# Patient Record
Sex: Female | Born: 1935 | Race: White | Hispanic: No | Smoking: Former smoker
Health system: Southern US, Community
[De-identification: ages and names within clinical notes are randomized; demographics above are authoritative.]

## PROBLEM LIST (undated history)

## (undated) DIAGNOSIS — R011 Cardiac murmur, unspecified: Secondary | ICD-10-CM

## (undated) DIAGNOSIS — R0789 Other chest pain: Secondary | ICD-10-CM

## (undated) DIAGNOSIS — M545 Low back pain, unspecified: Secondary | ICD-10-CM

## (undated) DIAGNOSIS — J309 Allergic rhinitis, unspecified: Secondary | ICD-10-CM

## (undated) DIAGNOSIS — G4700 Insomnia, unspecified: Secondary | ICD-10-CM

## (undated) DIAGNOSIS — R4701 Aphasia: Secondary | ICD-10-CM

## (undated) DIAGNOSIS — F329 Major depressive disorder, single episode, unspecified: Secondary | ICD-10-CM

## (undated) DIAGNOSIS — I340 Nonrheumatic mitral (valve) insufficiency: Secondary | ICD-10-CM

## (undated) DIAGNOSIS — C44300 Unspecified malignant neoplasm of skin of unspecified part of face: Secondary | ICD-10-CM

## (undated) DIAGNOSIS — E7800 Pure hypercholesterolemia, unspecified: Secondary | ICD-10-CM

## (undated) DIAGNOSIS — G8929 Other chronic pain: Secondary | ICD-10-CM

## (undated) DIAGNOSIS — I6529 Occlusion and stenosis of unspecified carotid artery: Secondary | ICD-10-CM

## (undated) DIAGNOSIS — I350 Nonrheumatic aortic (valve) stenosis: Secondary | ICD-10-CM

## (undated) DIAGNOSIS — R220 Localized swelling, mass and lump, head: Secondary | ICD-10-CM

## (undated) DIAGNOSIS — I10 Essential (primary) hypertension: Secondary | ICD-10-CM

## (undated) DIAGNOSIS — F32A Depression, unspecified: Secondary | ICD-10-CM

## (undated) DIAGNOSIS — H3530 Unspecified macular degeneration: Secondary | ICD-10-CM

## (undated) DIAGNOSIS — F419 Anxiety disorder, unspecified: Secondary | ICD-10-CM

## (undated) DIAGNOSIS — S72002A Fracture of unspecified part of neck of left femur, initial encounter for closed fracture: Secondary | ICD-10-CM

## (undated) DIAGNOSIS — M1990 Unspecified osteoarthritis, unspecified site: Secondary | ICD-10-CM

## (undated) DIAGNOSIS — K644 Residual hemorrhoidal skin tags: Secondary | ICD-10-CM

## (undated) HISTORY — DX: Pure hypercholesterolemia, unspecified: E78.00

## (undated) HISTORY — PX: BREAST BIOPSY: SHX20

## (undated) HISTORY — DX: Allergic rhinitis, unspecified: J30.9

## (undated) HISTORY — PX: TONSILLECTOMY: SHX5217

## (undated) HISTORY — DX: Nonrheumatic mitral (valve) insufficiency: I34.0

## (undated) HISTORY — DX: Other chest pain: R07.89

## (undated) HISTORY — DX: Essential (primary) hypertension: I10

## (undated) HISTORY — DX: Localized swelling, mass and lump, head: R22.0

## (undated) HISTORY — PX: DILATION AND CURETTAGE OF UTERUS: SHX78

## (undated) HISTORY — PX: CATARACT EXTRACTION W/ INTRAOCULAR LENS  IMPLANT, BILATERAL: SHX1307

## (undated) HISTORY — PX: SEPTOPLASTY: SUR1290

## (undated) HISTORY — PX: CHOLECYSTECTOMY: SHX55

---

## 1995-01-30 HISTORY — PX: ORIF HIP FRACTURE: SHX2125

## 1999-01-30 DIAGNOSIS — R220 Localized swelling, mass and lump, head: Secondary | ICD-10-CM

## 1999-01-30 HISTORY — DX: Localized swelling, mass and lump, head: R22.0

## 1999-10-05 ENCOUNTER — Encounter: Admission: RE | Admit: 1999-10-05 | Discharge: 1999-10-05 | Payer: Self-pay | Admitting: Otolaryngology

## 1999-10-05 ENCOUNTER — Encounter: Payer: Self-pay | Admitting: Otolaryngology

## 1999-10-11 ENCOUNTER — Encounter (INDEPENDENT_AMBULATORY_CARE_PROVIDER_SITE_OTHER): Payer: Self-pay | Admitting: Specialist

## 1999-10-11 ENCOUNTER — Inpatient Hospital Stay (HOSPITAL_COMMUNITY): Admission: RE | Admit: 1999-10-11 | Discharge: 1999-10-12 | Payer: Self-pay | Admitting: Otolaryngology

## 2001-09-23 ENCOUNTER — Other Ambulatory Visit: Admission: RE | Admit: 2001-09-23 | Discharge: 2001-09-23 | Payer: Self-pay | Admitting: Obstetrics and Gynecology

## 2003-01-06 ENCOUNTER — Ambulatory Visit (HOSPITAL_COMMUNITY): Admission: RE | Admit: 2003-01-06 | Discharge: 2003-01-06 | Payer: Self-pay | Admitting: Geriatric Medicine

## 2003-07-14 ENCOUNTER — Ambulatory Visit (HOSPITAL_COMMUNITY): Admission: RE | Admit: 2003-07-14 | Discharge: 2003-07-14 | Payer: Self-pay | Admitting: Geriatric Medicine

## 2005-05-29 ENCOUNTER — Encounter: Admission: RE | Admit: 2005-05-29 | Discharge: 2005-05-29 | Payer: Self-pay | Admitting: Geriatric Medicine

## 2005-07-29 DIAGNOSIS — S72002A Fracture of unspecified part of neck of left femur, initial encounter for closed fracture: Secondary | ICD-10-CM

## 2005-07-29 HISTORY — DX: Fracture of unspecified part of neck of left femur, initial encounter for closed fracture: S72.002A

## 2005-08-02 ENCOUNTER — Inpatient Hospital Stay (HOSPITAL_COMMUNITY): Admission: EM | Admit: 2005-08-02 | Discharge: 2005-08-04 | Payer: Self-pay | Admitting: Emergency Medicine

## 2005-09-26 ENCOUNTER — Encounter: Admission: RE | Admit: 2005-09-26 | Discharge: 2005-09-26 | Payer: Self-pay | Admitting: Orthopaedic Surgery

## 2006-09-11 ENCOUNTER — Encounter: Admission: RE | Admit: 2006-09-11 | Discharge: 2006-09-11 | Payer: Self-pay | Admitting: Geriatric Medicine

## 2007-12-12 ENCOUNTER — Emergency Department (HOSPITAL_COMMUNITY): Admission: EM | Admit: 2007-12-12 | Discharge: 2007-12-12 | Payer: Self-pay | Admitting: Emergency Medicine

## 2008-01-20 ENCOUNTER — Encounter (INDEPENDENT_AMBULATORY_CARE_PROVIDER_SITE_OTHER): Payer: Self-pay | Admitting: Sports Medicine

## 2008-01-20 ENCOUNTER — Ambulatory Visit: Admission: RE | Admit: 2008-01-20 | Discharge: 2008-01-20 | Payer: Self-pay | Admitting: Sports Medicine

## 2008-01-20 ENCOUNTER — Ambulatory Visit: Payer: Self-pay | Admitting: Vascular Surgery

## 2008-01-30 HISTORY — PX: SKIN CANCER EXCISION: SHX779

## 2009-05-10 ENCOUNTER — Encounter: Admission: RE | Admit: 2009-05-10 | Discharge: 2009-05-10 | Payer: Self-pay | Admitting: Geriatric Medicine

## 2010-05-30 HISTORY — PX: SUBMANDIBULAR GLAND EXCISION: SHX2456

## 2010-06-16 NOTE — Discharge Summary (Signed)
[place]  Patient:    Jeanne Burns, Jeanne Burns                      MRN: [id number] Adm. Date:  [id number] Disc. Date: [id number] Attending:  Merrie Roof CC:         Dr. Elvin So, [place], [place]   Discharge Summary  ADMISSION DIAGNOSIS:  Left submandibular mass.  POSTOPERATIVE DIAGNOSIS:  Left submandibular mass.  OPERATION:  Excision of left submandibular gland and panendoscopy.  SURGEON:  Carolan Shiver, M.D.  ASSISTANT:  Kinnie Scales. Annalee Genta, M.D.  ANESTHESIA:  General endotracheal, Dr. Zoila Shutter.  COMPLICATIONS:  None.  DISCHARGE STATUS:  Stable.  SUMMARY OF HOSPITALIZATION:  The patient is an 77 year old white female who presented approximately one week ago with a history of a left submandibular mass of undetermined origin and for an undetermined period of time.  Two weeks prior to the presentation to my office she had a left parotitis treated with Keflex by her family physician, and she was seeing me in follow-up to check her left parotid.  During that examination she was found to have a 2 cm rock hard left submandibular mass without any other cervical lymphadenopathy.  The remainder of her head and neck examination was negative for an occult primary. An MRI head and neck scan documented the left submandibular mass.  Chest x-ray showed some right upper lobe nodules which eventually were found to have been present on old films from [place], [place], taken November 1997, during a hip fracture treatment.  Laboratory data was within normal limits, and the patient was recommended for panendoscopy and excision of the left submandibular gland as an excisional biopsy.  She was prepared for radical neck dissection if the lesion was a squamous cell carcinoma.  On October 11, 1999, she was taken to the main operating room at [facility], underwent an uncomplicated excision of her left submandibular gland as an excisional  biopsy.  Prior to excising the gland, a panendoscopy consisting of direct laryngoscopy and rigid bronchoscopy and esophagoscopy had been performed and no lesions were found.  The frozen section on the gland mass which was 2 cm and very hard, was read as inflammatory by Dr. Debby Bud.  The patient was cared for in the PACU, and then transferred to 3300 bed #2 step-down unit where she had an uncomplicated afebrile postoperative course. She was awake, alert, eating and drinking in the first postoperative day.  Her left submandibular drain was removed.  There was no hematoma, and her left ramus mandibularis function was intact.  She was discharged in the morning of October 12, 1999, and was instructed to return to my office in one week for follow up.  DIET:  Soft diet x 1 weeks.  ACTIVITY:  Keep her head elevated, and avoid heavy lifting or straining.  DISCHARGE MEDICATIONS: 1. Augmentin 875 mg p.o. b.i.d. x 10 days with food. 2. Vioxx 50 mg p.o. q.d. x 1 week. 3. Percocet #30 one or two p.o. q.6h. p.r.n. pain. 4. Phenergan 25 mg #2 one p.o. q.6h. p.r.n. nausea. 5. She is to continue on her home medications of Prozac and Librium.  ADMISSION LABORATORY DATA:  She was A+ blood type.  Chest x-ray showed no active disease, again compared to her November 1997 films in [place]. White blood cell count was 7200, hemoglobin 13.7, hematocrit 39.2, platelet count 305,000.  PT was 12.8, PTT 29, INR 1.0.  EKG  was unremarkable.  At the time of the discharge summary dictation permanent pathologic evaluation of the left submandibular mass had not been completed.  During hospitalization, she was on 3300 bed #2 in the step-down unit. DD:  10/12/99 TD:  10/13/99 Job: [id number]

## 2010-06-16 NOTE — Op Note (Signed)
[place]  Patient:    Jeanne Burns, Jeanne Burns                      MRN: [id number] Proc. Date: 10/11/99 Adm. Date:  [id number] Disc. Date: [id number] Attending:  Merrie Roof                           Operative Report  INDICATION FOR PROCEDURE:  Huxley Vanwagoner is an 77 year old white female with a left submandibular mass suspicious for malignancy.  She was here today for an excision of her left submandibular gland and possible left radical neck dissection.  She presented to my office on October 03, 1999, with a 2-week history of left parotid swelling for which she had been treated by her family doctor with Keflex for 1 week.  She was being seen by me in followup for that left parotitis.  On physical examination her left parotid was normal, however, she was found to have a stony hard mass in her left submandibular triangle thought to be either in the submandibular gland itself or a lymph node attached to the gland.  She had clear saliva from left Whartons duct, no palpable stones and no history of previous sialadenitis.  An MRI head scan with and without gadolinium showed a 1-1/2 to 2 cm mass in the left submandibular gland without any other cervical lymphadenopathy.  Chest x-ray showed 3 nodules in the right upper lobe.  This was compared to a chest x-ray done in November of 1997 after a hip fracture repair and there was thought to be no interval change on the chest x-ray.  She had no obvious primary lesion in her oropharynx or hypopharynx and MRI showed some cervical disk disease and thickening of her sinus mucosa, however, no occult paranasal sinus lesions.  Mrs. Simerly was counseled that this could be a malignancy and was recommended for an excisional biopsy rather than an F&A.  She was also told that if this represented a squamous cell carcinoma from an occult primary she would require a left radical neck dissection and postoperative  radiation or chemotherapy. The risks and complications of the procedures were explained to her. Questions are invited and answered and informed consent was signed.  JUSTIFICATION FOR INPATIENT SETTING:  Patients age and need for general endotracheal anesthesia.  JUSTIFICATION FOR OVERNIGHT STAY:  Observation of her airway after a left submandibular gland excision in an 77 year old white female.  PREOPERATIVE DIAGNOSES:  Left submandibular mass.  POSTOPERATIVE DIAGNOSES:  Left submandibular mass, frozen section positive for inflammation.  PROCEDURE:  Left submandibular gland excision and panendoscopy.  SURGEON:  Carolan Shiver, M.D.  FIRST ASSISTANT:  Dr. Annalee Genta.  ANESTHESIA:  General endotracheal anesthesia Dr. Zoila Shutter.  COMPLICATIONS:  None.  SUMMARY OF REPORT:  After the patient was taken to the operating room she was placed in supine position.  An IV had been begun in the holding area.  She had received 1 gram of IV Ancef, 4 mg of IV Zofran, 10 mg of IV Decadron and Vioxx 50 mg p.o.  General IV induction was then performed by Dr. Zoila Shutter.  The patient was properly positioned and monitored.  Elbows and ankles were padded with foam rubber, pillow was placed beneath her knees and a Foley catheter was inserted.  A left submandibular incision was marked in a horizontal skin crease 4 cm inferior to the body of the mandible and  infiltrated with 2 cc. of 1% Xylocaine with 1:200,000 epinephrine.  A rolled towel was placed beneath her shoulders and her neck was hyperextended.  Her left neck was then prepped in the standard fashion for radical neck dissection.  She was then draped in a standard fashion.  A 6 cm incision was then made in the left submandibular triangle and in the horizontal skin crease and carried down through skin and platysma muscle. Superior and inferior flaps were elevated in a subplatysmal plane.  Great care was taken to guard the left ramus mandibularis  nerve.  The submandibular gland capsule was then incised and the capsule was elevated and retracted for a place medial to the capsule.  There was a rock hard mass in the anterior aspect of the submandibular gland clearly part of the gland not attached to the gland.  Capsule in this area was dissected and left attacked to the gland proper.  Attention  was then turned to the perimeter of the gland.  The dissection was carried posteriorly to the anterior border of the sternocleidomastoid muscle, inferiorly to the posterior belly of the digastric, anteriorly to the mylohyoid and superiorly toward the mandible.  The common facial vein was then ligated with 4-0 silk ties, and the external maxillary artery was then ligated with 4-0 silk ties and 4-0 silk stick ties.  Dissection was then carried inferiorly.  The common digastric tendon was identified as was the hypoglossal nerve and lingual veins.  The gland was dissected from the hyoglossus muscle and small veins were ligated.  The mylohyoid muscle was then retracted anteriorly and the dissection was carried to [facility] duct.  The duct was identified and ligated with 4-0 silk ties.  The lingual identified as was the submandibular ganglion.  The ganglion was then separated from the gland with 4-0 silk ties.  The gland was then dissected from the bed and removed and marked with a silk suture.  It was sent to pathology for frozen section.  Dr. Debby Bud read the frozen section as consistent with inflammation and no evidence of any malignancy.  The site was then copiously irrigated with antibiotic containing saline and a 1/4 inch Penrose drain was placed.  The incision was then closed in 3 layers using interrupted inverted 3-0 chromics for a platysmal layer.  The same suture for a subcutaneous layer and skin was closed with a running 5-0 ethilon.  A Telfa 4 x 4 hypofixed dressing was then applied and the patient was ready for awakening.  The Foley  catheter was removed.  She was awakened, extubated and transferred to her hospital bed.  She appeared to tolerate both the general anesthesia and the procedures well and left the operating room in  stable condition.  I should mention that prior to beginning the neck portion of the procedure a panendoscopy was performed.  Visual inspection of the oral cavity and oral pharynx was then done as well as palpation of her entire oral cavity, anterior forward mouth, tongue and base of tongue.  no lesions were observable or palpable.  A direct laryngoscopy was performed with a Dedo laryngoscope and no hypopharyngeal or endolaryngeal lesions were found.  The piriform sinuses were clear.  A rigid esophagoscopy was then performed with a #8 esophagoscope.  The scope was passed almost to the GE junction and no esophageal lesions were found either in the proximal mid portion or distal portion of the esophagus.  A #7 adult rigid bronchoscope was then inserted into the trachea  after the tracheal tube was removed.  Examination of right and left main stem bronchi as well as some secondary bronchi of the carina, trachea and subglottic area were all normal as were her true vocal cords.  The patient was reintubated without difficulty using a straight blade.  Mrs. Hinds will be admitted to the 3300 step-down unit for IV hydration, pain control and observation of her airway.  If stable overnight she will be discharged on October 12, 1999, and will be instructed to return to my office in 1 week for followup.  DISCHARGE MEDICATIONS: 1. Augmentin 875 mg p.o. b.i.d. x 10 days with food. 2. Percocet #30 1-2 p.o. q.6h. p.r.n. pain. 3. Vioxx 50 mg p.o. q. day x 1 week. 4. Phenergan suppositories 25 mg 1 per rectum q.6h. p.r.n. nausea.  She is to keep her head elevated.  Follow a soft diet x 1 week and call [phone] for any postoperative problems.  She will be given both verbal and written instructions. DD:   10/11/99 TD:  10/12/99 Job: [id number]

## 2010-06-16 NOTE — Discharge Summary (Signed)
[name], Jeanne Burns               ACCOUNT NO.:  [id number]   MEDICAL RECORD NO.:  [id number]          PATIENT TYPE:  INP   LOCATION:  5029                         FACILITY:  [facility]   PHYSICIAN:  Corinna L. Lendell Caprice, MDDATE OF BIRTH:  1915-12-11   DATE OF ADMISSION:  08/01/2005  DATE OF DISCHARGE:  08/04/2005                                 DISCHARGE SUMMARY   DISCHARGE DIAGNOSES:  1.  Status post fall with resulting, nondisplaced, left inferior pubic ramus      fracture.  2.  History of depression.   DISCHARGE MEDICATIONS:  1.  Xanax 0.5 mg p.o. four times a day p.r.n.  2.  Prozac 40 mg a day.  3.  Seroquel 25 mg p.o. nightly.  4.  Aspirin 81 mg a day.  5.  Tylenol 650 mg p.o. q.4h. p.r.n. pain.  6.  Vicodin one p.o. q.4h. p.r.n. pain.   ACTIVITY:  Weightbearing as tolerated with the walker.   CONDITION ON DISCHARGE:  Stable.   CONSULTATIONS:  Jeanne Burns, M.D.   PROCEDURES:  None.   DIET:  Regular.   LABORATORY DATA AND X-RAY FINDINGS:  Pertinent laboratories showed CBC  unremarkable.  Basic metabolic panel significant for a glucose of 206.  Liver function tests significant for a total bilirubin of 1.4, otherwise  unremarkable.  Hemoglobin A1c 6.0.  UA negative.   X-ray of the chest negative.  X-ray of the left hip showed a nondisplaced  left inferior pubic ramus fracture.   HOSPITAL COURSE:  [name] is a pleasant, 77 year old, white female  patient of Dr. Pete Glatter who tripped and sustained a pelvic fracture.  She  came from assisted-living.  She was admitted for pain control and physical  therapy.  She did not want skilled nursing facility placement, but is going  back to assisted-living with home PT/OT.  Her blood glucose was initially  elevated, but blood glucose measurements subsequently were essentially  normal and her hemoglobin A16 was 6.0.  This was stress related, but will  need to be followed.      Corinna L. Lendell Caprice, MD  Electronically  Signed     CLS/MEDQ  D:  08/03/2005  T:  08/03/2005  Job:  [id number]   cc:   Hal T. Stoneking, M.D.  Fax: [phone]   Jeanne Burns, M.D.  Fax: [phone]

## 2010-06-16 NOTE — H&P (Signed)
Jeanne Burns, Jeanne Burns               ACCOUNT NO.:  [id number]   MEDICAL RECORD NO.:  [id number]          PATIENT TYPE:  INP   LOCATION:  5029                         FACILITY:  [facility]   PHYSICIAN:  Thora Lance, M.D.  DATE OF BIRTH:  1915/10/17   DATE OF ADMISSION:  08/01/2005  DATE OF DISCHARGE:                                HISTORY & PHYSICAL   CHIEF COMPLAINT:  Pelvic pain.   HISTORY OF PRESENT ILLNESS:  [age]-year-old white female with a  history of depression who bumped her head on a door at home while getting  ready for bed and then lost her balance and fell.  She developed pain in her  pelvis and could not ambulate.  She was taken to the ER where an x-ray  showed a nondisplaced left inferior pubic ramus fracture.   PAST MEDICAL HISTORY:  Depression.   PAST SURGICAL HISTORY:  Fracture of left hip, cholecystectomy,  tonsillectomy, salivary gland.   ALLERGIES:  No known drug allergies.   MEDICATIONS:  1.  Xanax 0.5 mg q.i.d.  2.  Prozac 40 mg daily.  3.  Seroquel 25 mg q.h.s.  4.  Aspirin 81 mg a day.   SOCIAL HISTORY:  Lives independently at [facility].   PHYSICAL EXAMINATION:  VITAL SIGNS:  Blood pressure 190/86, heart rate 98,  respirations 20, temperature 97, oxygen saturation 95% on room air.  HEENT:  Pupils equal and respond to light.  Anicteric.  Oropharynx clear.  NECK:  Bilateral radiating murmurs heard over carotids.  Carotids 2/2.  LUNGS:  Clear.  HEART:  Regular rate and rhythm with a 2/6 systolic ejection murmur right  upper sternal border.  ABDOMEN:  Soft, nontender.  Normal bowel sounds.  No masses.  EXTREMITIES:  No edema.   LABORATORY DATA:  Pending.  X-ray shows a nondisplaced left inferior pubic  ramus fracture.   ASSESSMENT:  Left inferior pubic ramus fracture.   PLAN:  Admit.  Physical therapy.  Pain control.  Orthopedics consult.  Outpatient medications.           ______________________________  Thora Lance, M.D.     JJG/MEDQ  D:  08/02/2005  T:  08/02/2005  Job:  [id number]

## 2010-06-16 NOTE — Consult Note (Signed)
NAMECARISHA, Jeanne Burns               ACCOUNT NO.:  [id number]   MEDICAL RECORD NO.:  [id number]          PATIENT TYPE:  INP   LOCATION:  5029                         FACILITY:  [facility]   PHYSICIAN:  Claude Manges. Whitfield, M.D.DATE OF BIRTH:  March 08, 1915   DATE OF CONSULTATION:  08/02/2005  DATE OF DISCHARGE:                                   CONSULTATION   CHIEF COMPLAINT:  Left hip pain.   HISTORY OF PRESENT ILLNESS:  This 77 year old female is a resident of  [facility] and fell at her apartment on the evening of  August 01, 2005, after hitting my head on the door.  She experienced  immediate onset of left hip pain with an inability to bear weight and was  brought to the emergency room via ambulance with films noting a  nondisplaced, left inferior, pubic rami fracture.  She had a prior in situ  left hip pinning for a nondisplaced fracture approximately 10 years ago  without further difficulty.  She is admitted for pain control and  weightbearing activity with therapy.   PHYSICAL EXAMINATION:  GENERAL:  She was sitting up in bed, eating her  dinner without related pain.  NEUROVASCULAR:  Intact to both lower extremities.  There was no edema.  She  had pain in her left groin to palpation and with left hip motion.   LABORATORY DATA AND X-RAY FINDINGS:  Films reveal a nondisplaced, left  inferior pubic rami fracture.  She has an old healed, left hip fracture.   RECOMMENDATIONS:  1.  Symptomatic treatment with mild pain medicine or anti-inflammatory      medications.  2.  Out of bed to chair or wheelchair depending on her comfort level.  She      can be up walking with therapy with a walker as with weightbearing as      tolerated.  I suspect she will have some difficulty for approximately 6      weeks, but can be discharged to [facility] with some help when she is      comfortable.  3.  We will follow while she is in the hospital.      Claude Manges. Cleophas Dunker, M.D.  Electronically Signed     PWW/MEDQ  D:  08/02/2005  T:  08/02/2005  Job:  [id number]

## 2010-07-03 ENCOUNTER — Other Ambulatory Visit: Payer: Self-pay | Admitting: Orthopaedic Surgery

## 2010-07-03 DIAGNOSIS — M545 Low back pain: Secondary | ICD-10-CM

## 2010-07-04 ENCOUNTER — Ambulatory Visit
Admission: RE | Admit: 2010-07-04 | Discharge: 2010-07-04 | Disposition: A | Discharge status: Home / Self Care | Payer: Medicare Other | Source: Ambulatory Visit | Attending: Orthopaedic Surgery | Admitting: Orthopaedic Surgery

## 2010-07-04 DIAGNOSIS — M545 Low back pain: Secondary | ICD-10-CM

## 2012-05-23 ENCOUNTER — Encounter (HOSPITAL_COMMUNITY): Payer: Self-pay | Admitting: Emergency Medicine

## 2012-05-23 ENCOUNTER — Observation Stay (HOSPITAL_COMMUNITY)
Admission: EM | Admit: 2012-05-23 | Discharge: 2012-05-24 | Disposition: A | Discharge status: Home / Self Care | Payer: Medicare Other | Attending: Internal Medicine | Admitting: Internal Medicine

## 2012-05-23 ENCOUNTER — Observation Stay (HOSPITAL_COMMUNITY): Payer: Medicare Other

## 2012-05-23 ENCOUNTER — Emergency Department (HOSPITAL_COMMUNITY): Payer: Medicare Other

## 2012-05-23 DIAGNOSIS — R4789 Other speech disturbances: Secondary | ICD-10-CM

## 2012-05-23 DIAGNOSIS — R2990 Unspecified symptoms and signs involving the nervous system: Secondary | ICD-10-CM

## 2012-05-23 DIAGNOSIS — G8929 Other chronic pain: Secondary | ICD-10-CM | POA: Insufficient documentation

## 2012-05-23 DIAGNOSIS — M549 Dorsalgia, unspecified: Secondary | ICD-10-CM | POA: Insufficient documentation

## 2012-05-23 DIAGNOSIS — R29818 Other symptoms and signs involving the nervous system: Secondary | ICD-10-CM

## 2012-05-23 DIAGNOSIS — I658 Occlusion and stenosis of other precerebral arteries: Secondary | ICD-10-CM | POA: Insufficient documentation

## 2012-05-23 DIAGNOSIS — Z66 Do not resuscitate: Secondary | ICD-10-CM | POA: Insufficient documentation

## 2012-05-23 DIAGNOSIS — F411 Generalized anxiety disorder: Secondary | ICD-10-CM | POA: Insufficient documentation

## 2012-05-23 DIAGNOSIS — G4700 Insomnia, unspecified: Secondary | ICD-10-CM | POA: Insufficient documentation

## 2012-05-23 DIAGNOSIS — E785 Hyperlipidemia, unspecified: Secondary | ICD-10-CM | POA: Insufficient documentation

## 2012-05-23 DIAGNOSIS — R4701 Aphasia: Principal | ICD-10-CM | POA: Insufficient documentation

## 2012-05-23 DIAGNOSIS — F329 Major depressive disorder, single episode, unspecified: Secondary | ICD-10-CM | POA: Insufficient documentation

## 2012-05-23 DIAGNOSIS — I359 Nonrheumatic aortic valve disorder, unspecified: Secondary | ICD-10-CM | POA: Insufficient documentation

## 2012-05-23 DIAGNOSIS — F3289 Other specified depressive episodes: Secondary | ICD-10-CM | POA: Insufficient documentation

## 2012-05-23 DIAGNOSIS — H3530 Unspecified macular degeneration: Secondary | ICD-10-CM | POA: Insufficient documentation

## 2012-05-23 HISTORY — DX: Unspecified malignant neoplasm of skin of unspecified part of face: C44.300

## 2012-05-23 HISTORY — DX: Nonrheumatic aortic (valve) stenosis: I35.0

## 2012-05-23 HISTORY — DX: Aphasia: R47.01

## 2012-05-23 HISTORY — DX: Low back pain, unspecified: M54.50

## 2012-05-23 HISTORY — DX: Fracture of unspecified part of neck of left femur, initial encounter for closed fracture: S72.002A

## 2012-05-23 HISTORY — DX: Occlusion and stenosis of unspecified carotid artery: I65.29

## 2012-05-23 HISTORY — DX: Unspecified macular degeneration: H35.30

## 2012-05-23 HISTORY — DX: Cardiac murmur, unspecified: R01.1

## 2012-05-23 HISTORY — DX: Residual hemorrhoidal skin tags: K64.4

## 2012-05-23 HISTORY — DX: Insomnia, unspecified: G47.00

## 2012-05-23 HISTORY — DX: Depression, unspecified: F32.A

## 2012-05-23 HISTORY — DX: Other chronic pain: G89.29

## 2012-05-23 HISTORY — DX: Anxiety disorder, unspecified: F41.9

## 2012-05-23 HISTORY — DX: Major depressive disorder, single episode, unspecified: F32.9

## 2012-05-23 HISTORY — DX: Unspecified osteoarthritis, unspecified site: M19.90

## 2012-05-23 HISTORY — DX: Low back pain: M54.5

## 2012-05-23 LAB — TROPONIN I: Troponin I: <0.3 ng/mL (ref <0.30)

## 2012-05-23 LAB — URINE MICROSCOPIC-ADD ON

## 2012-05-23 LAB — PROTIME-INR
INR: 0.95 (ref 0.00–1.49)
Prothrombin Time: 12.6 seconds (ref 11.6–15.2)

## 2012-05-23 LAB — POCT I-STAT, CHEM 8
BUN: 27 mg/dL — ABNORMAL HIGH (ref 6–23)
Calcium, Ion: 1.15 mmol/L (ref 1.13–1.30)
Chloride: 103 meq/L (ref 96–112)
Creatinine, Ser: 0.8 mg/dL (ref 0.50–1.10)
Glucose, Bld: 75 mg/dL (ref 70–99)
HCT: 39 % (ref 36.0–46.0)
Hemoglobin: 13.3 g/dL (ref 12.0–15.0)
Potassium: 4.2 mEq/L (ref 3.5–5.1)
Sodium: 140 mEq/L (ref 135–145)
TCO2: 29 mmol/L (ref 0–100)

## 2012-05-23 LAB — COMPREHENSIVE METABOLIC PANEL WITH GFR
ALT: 28 U/L (ref 0–35)
Albumin: 3.8 g/dL (ref 3.5–5.2)
CO2: 30 meq/L (ref 19–32)
Calcium: 9.2 mg/dL (ref 8.4–10.5)
Chloride: 102 meq/L (ref 96–112)
GFR calc Af Amer: 83 mL/min — ABNORMAL LOW (ref >90)
GFR calc non Af Amer: 71 mL/min — ABNORMAL LOW (ref >90)
Potassium: 4.2 meq/L (ref 3.5–5.1)
Sodium: 140 meq/L (ref 135–145)
Total Bilirubin: 0.7 mg/dL (ref 0.3–1.2)

## 2012-05-23 LAB — POCT I-STAT TROPONIN I: Troponin i, poc: 0 ng/mL (ref 0.00–0.08)

## 2012-05-23 LAB — COMPREHENSIVE METABOLIC PANEL
AST: 26 U/L (ref 0–37)
Alkaline Phosphatase: 57 U/L (ref 39–117)
BUN: 27 mg/dL — ABNORMAL HIGH (ref 6–23)
Creatinine, Ser: 0.72 mg/dL (ref 0.50–1.10)
Glucose, Bld: 74 mg/dL (ref 70–99)
Total Protein: 7 g/dL (ref 6.0–8.3)

## 2012-05-23 LAB — CBC
HCT: 37.3 % (ref 36.0–46.0)
Hemoglobin: 12.6 g/dL (ref 12.0–15.0)
MCH: 31 pg (ref 26.0–34.0)
MCHC: 33.8 g/dL (ref 30.0–36.0)
MCV: 91.6 fL (ref 78.0–100.0)
MCV: 92.9 fL (ref 78.0–100.0)
Platelets: 202 K/uL (ref 150–400)
Platelets: 208 10*3/uL (ref 150–400)
RBC: 4.07 MIL/uL (ref 3.87–5.11)
RDW: 13.5 % (ref 11.5–15.5)
RDW: 13.6 % (ref 11.5–15.5)
WBC: 8.9 10*3/uL (ref 4.0–10.5)
WBC: 8.2 10*3/uL (ref 4.0–10.5)

## 2012-05-23 LAB — URINALYSIS, ROUTINE W REFLEX MICROSCOPIC
Bilirubin Urine: NEGATIVE
Glucose, UA: NEGATIVE mg/dL
Hgb urine dipstick: NEGATIVE
Ketones, ur: NEGATIVE mg/dL
Nitrite: NEGATIVE
Protein, ur: NEGATIVE mg/dL
Specific Gravity, Urine: 1.019 (ref 1.005–1.030)
Urobilinogen, UA: 0.2 mg/dL (ref 0.0–1.0)
pH: 8 (ref 5.0–8.0)

## 2012-05-23 LAB — DIFFERENTIAL
Basophils Absolute: 0 K/uL (ref 0.0–0.1)
Basophils Relative: 0 % (ref 0–1)
Eosinophils Absolute: 0 10*3/uL (ref 0.0–0.7)
Eosinophils Relative: 0 % (ref 0–5)
Lymphocytes Relative: 36 % (ref 12–46)
Lymphs Abs: 3.2 10*3/uL (ref 0.7–4.0)
Monocytes Absolute: 0.8 K/uL (ref 0.1–1.0)
Monocytes Relative: 9 % (ref 3–12)
Neutro Abs: 4.9 10*3/uL (ref 1.7–7.7)
Neutrophils Relative %: 55 % (ref 43–77)

## 2012-05-23 LAB — RAPID URINE DRUG SCREEN, HOSP PERFORMED
Amphetamines: NOT DETECTED
Barbiturates: NOT DETECTED
Benzodiazepines: POSITIVE — AB
Cocaine: NOT DETECTED
Opiates: NOT DETECTED
Tetrahydrocannabinol: NOT DETECTED

## 2012-05-23 LAB — ETHANOL: Alcohol, Ethyl (B): <11 mg/dL (ref 0–11)

## 2012-05-23 LAB — APTT: aPTT: 32 seconds (ref 24–37)

## 2012-05-23 LAB — CREATININE, SERUM
Creatinine, Ser: 0.64 mg/dL (ref 0.50–1.10)
GFR calc Af Amer: 86 mL/min — ABNORMAL LOW (ref >90)

## 2012-05-23 MED ORDER — SODIUM CHLORIDE 0.9 % IV BOLUS (SEPSIS)
500.0000 mL | Freq: Once | INTRAVENOUS | Status: AC
  Administered 2012-05-23: 500 mL via INTRAVENOUS

## 2012-05-23 MED ORDER — ACETAMINOPHEN 500 MG PO TABS
500.0000 mg | ORAL_TABLET | Freq: Three times a day (TID) | ORAL | Status: DC | PRN
  Administered 2012-05-24: 500 mg via ORAL
  Filled 2012-05-23: qty 1

## 2012-05-23 MED ORDER — FLUOXETINE HCL 20 MG PO CAPS
60.0000 mg | ORAL_CAPSULE | Freq: Every day | ORAL | Status: DC
  Administered 2012-05-24: 40 mg via ORAL
  Filled 2012-05-23: qty 3

## 2012-05-23 MED ORDER — SODIUM CHLORIDE 0.9 % IV SOLN: 250.0000 mL | INTRAVENOUS | Status: DC | PRN

## 2012-05-23 MED ORDER — ALUM & MAG HYDROXIDE-SIMETH 200-200-20 MG/5ML PO SUSP: 30.0000 mL | Freq: Four times a day (QID) | ORAL | Status: DC | PRN

## 2012-05-23 MED ORDER — ONDANSETRON HCL 4 MG/2ML IJ SOLN
4.0000 mg | Freq: Four times a day (QID) | INTRAMUSCULAR | Status: DC | PRN
  Administered 2012-05-24: 4 mg via INTRAVENOUS
  Filled 2012-05-23: qty 2

## 2012-05-23 MED ORDER — SODIUM CHLORIDE 0.9 % IJ SOLN: 3.0000 mL | INTRAMUSCULAR | Status: DC | PRN

## 2012-05-23 MED ORDER — ALPRAZOLAM 0.5 MG PO TABS
1.0000 mg | ORAL_TABLET | Freq: Three times a day (TID) | ORAL | Status: DC | PRN
  Administered 2012-05-23 – 2012-05-24 (×2): 1 mg via ORAL
  Filled 2012-05-23 (×2): qty 2

## 2012-05-23 MED ORDER — TRAMADOL HCL 50 MG PO TABS
50.0000 mg | ORAL_TABLET | Freq: Three times a day (TID) | ORAL | Status: DC | PRN
  Administered 2012-05-23 – 2012-05-24 (×2): 50 mg via ORAL
  Filled 2012-05-23 (×2): qty 1

## 2012-05-23 MED ORDER — HEPARIN SODIUM (PORCINE) 5000 UNIT/ML IJ SOLN
5000.0000 [IU] | Freq: Three times a day (TID) | INTRAMUSCULAR | Status: DC
  Administered 2012-05-23 – 2012-05-24 (×2): 5000 [IU] via SUBCUTANEOUS
  Filled 2012-05-23 (×5): qty 1

## 2012-05-23 MED ORDER — QUETIAPINE FUMARATE 50 MG PO TABS
50.0000 mg | ORAL_TABLET | Freq: Every day | ORAL | Status: DC
  Administered 2012-05-23: 50 mg via ORAL
  Filled 2012-05-23 (×2): qty 1

## 2012-05-23 MED ORDER — SODIUM CHLORIDE 0.9 % IJ SOLN
3.0000 mL | Freq: Two times a day (BID) | INTRAMUSCULAR | Status: DC
  Administered 2012-05-23 – 2012-05-24 (×2): 3 mL via INTRAVENOUS

## 2012-05-23 MED ORDER — ONDANSETRON HCL 4 MG PO TABS: 4.0000 mg | ORAL_TABLET | Freq: Four times a day (QID) | ORAL | Status: DC | PRN

## 2012-05-23 NOTE — H&P (Signed)
[facility] History and Physical  CC: Word finding difficulty  Subjective:   Patient is a 79 y.o. female presents with word finding difficulty.  She notes that over the past couple of weeks she has had 2 incidents where she will be talking on the phone and will have difficulty saying the word she wants or that it "comes out wrong."  These episodes only lasted for a second or two and were separated by days.  She also describes a "funny" sensation in her head.  She spoke to her PCP Dr. Pete Glatter and was advised to come to the ED.  She has known carotid artery stenosis, but did not want further work up.  Her only other symptom is back pain from lying in bed too long.   Patient Active Problem List   Diagnosis Date Noted  . Word finding difficulty 05/23/2012   Past Medical History  Diagnosis Date  . Depression   . Hemorrhoids, external   . Macular degeneration   . Aortic stenosis     Past Surgical History  Procedure Laterality Date  . Cholecystectomy    . Cataract extraction w/ intraocular lens  implant, bilateral    . Hip fracture surgery      Prescriptions prior to admission  Medication Sig Dispense Refill  . acetaminophen (TYLENOL) 500 MG tablet Take 500 mg by mouth 3 (three) times daily as needed for pain.      Marland Kitchen ALPRAZolam (XANAX) 1 MG tablet Take 1 mg by mouth 3 (three) times daily as needed for anxiety.      Marland Kitchen aspirin EC 81 MG tablet Take 81 mg by mouth daily.      . Calcium Citrate (CITRACAL PO) Take 1 tablet by mouth daily.      Marland Kitchen FLUoxetine (PROZAC) 20 MG capsule Take 60 mg by mouth daily.      . Multiple Vitamins-Minerals (CENTRUM SILVER PO) Take 1 tablet by mouth daily.      . QUEtiapine (SEROQUEL) 50 MG tablet Take 50 mg by mouth at bedtime.      . traMADol (ULTRAM) 50 MG tablet Take 50 mg by mouth 3 (three) times daily as needed for pain.       No Known Allergies  History  Substance Use Topics  . Smoking status: Never Smoker   . Smokeless tobacco: Not on file   . Alcohol Use: No    History reviewed. No pertinent family history. in a patient her age.  Review of Systems Constitutional: negative for anorexia, chills, fatigue and fevers Eyes: positive for contacts/glasses, negative for irritation and redness Ears, nose, mouth, throat, and face: positive for dry mouth, chronic, negative for epistaxis, hoarseness and sore throat Respiratory: negative for cough and wheezing Cardiovascular: negative for chest pain, fatigue and tachypnea Gastrointestinal: negative for constipation, diarrhea, nausea and vomiting Genitourinary:negative for dysuria and frequency Musculoskeletal:positive for back pain, negative for myalgias and neck pain Neurological: positive for speech problems, negative for coordination problems, dizziness, gait problems and memory problems Behavioral/Psych: negative Endocrine: negative  Objective:   Patient Vitals for the past 8 hrs:  BP Temp Temp src Pulse Resp SpO2 Height Weight  05/23/12 1100 138/68 mmHg - - 83 21 94 % - -  05/23/12 1053 146/67 mmHg - - - - - - -  05/23/12 1042 - 97.8 F (36.6 C) Oral - - 97 % 5\' 3"  (1.6 m) 150 lb (68.04 kg)          BP 167/65  Pulse 88  Temp(Src) 98.1 F (36.7 C) (Oral)  Resp 20  Ht 5\' 3"  (1.6 m)  Wt 150 lb (68.04 kg)  BMI 26.58 kg/m2  SpO2 98% General appearance: alert, cooperative and appears stated age Head: Normocephalic, without obvious abnormality, atraumatic Lungs: clear to auscultation bilaterally and no wheezing Heart: RR, NR, + murmur (there forever, per patient) Abdomen: soft, +BS Extremities: thin, no edema Neuro: No acute changes, speech is fluent, CN intact, sensation intact, strength is 5/5 throughout, coordination is normal.  Gait not tested.    Data Review CBC:  Lab Results  Component Value Date   WBC 8.9 05/23/2012   RBC 4.07 05/23/2012   BMP:  Lab Results  Component Value Date   GLUCOSE 75 05/23/2012   CO2 30 05/23/2012   BUN 27* 05/23/2012    CREATININE 0.80 05/23/2012   CALCIUM 9.2 05/23/2012   Coagulation:  Lab Results  Component Value Date   INR 0.95 05/23/2012   APTT 32 05/23/2012   Radiology review:   CT Head IMPRESSION:  Chronic ischemic changes and atrophy.  MRI Head Pending  EEG Pending  Assessment:   Active Problems:   Word finding difficulty   Plan:   Word finding difficulty/expressive aphasia -   Given her age and history of carotid stenosis, will need to rule out TIA - Neurology following - EEG and MRI planned - If anything found, further work up based on results  Depression/Anxiety - Continue home medications  Insomnia - Continue home medications  Chronic back pain - Tylenol and tramadol  Code: DNR  Family Contact: Daughter, Rose.    Diet: Heart Healthy  IVF: None, encourage PO  Signed  MULLEN, EMILY  For overnight cross cover, call [facility] on call.

## 2012-05-23 NOTE — ED Provider Notes (Signed)
History     CSN: [id number]  Arrival date & time 05/23/12  1009   First MD Initiated Contact with Patient 05/23/12 1012      Chief Complaint  Patient presents with  . Aphasia    (Consider location/radiation/quality/duration/timing/severity/associated sxs/prior treatment) HPI Comments: Patient presents by EMS from her residence at [facility] for evaluation for possible stroke. She reports that she's had episodes of work finding difficulty or worsening in correct word over the past 2 weeks. She does not recall what her exact last episode was. She does not think it occurred this morning or even yesterday. She reports she is 79 years old and has poor memory is why she cannot recall. She also reports feeling a unusual sensation go across her head but denies actual headache. She wonders if this sensation has something to do with her stroke-like symptoms. She reports last night she decided to do something about these symptoms since they were persisting, this morning spoke to the nurse with her primary care physician's office, Dr. Corbin Ade who told her that she needed to call the ambulance and go to the emergency department. She reports she feels at her baseline at this time, has no significant concerns. She reports given her age, she did not want to do anything super aggressive. She reports for example, she has severe aortic stenosis but does not want to undergo a procedure to fix this. She reports also that she was found to have carotid stenosis on the right side by her primary care physician. They had also had a discussion about intervening on this and she declined as well, and therefore Dr. Corbin Ade had stopped following the stenosis.  The history is provided by the patient, a relative and the EMS personnel.    Past Medical History  Diagnosis Date  . Depression   . Hemorrhoids, external   . Macular degeneration   . Aortic stenosis     Past Surgical History  Procedure Laterality Date   . Cholecystectomy    . Cataract extraction w/ intraocular lens  implant, bilateral    . Hip fracture surgery      History reviewed. No pertinent family history.  History  Substance Use Topics  . Smoking status: Never Smoker   . Smokeless tobacco: Not on file  . Alcohol Use: No    OB History   Grav Para Term Preterm Abortions TAB SAB Ect Mult Living                  Review of Systems  Respiratory: Negative for shortness of breath.   Cardiovascular: Negative for chest pain and palpitations.  Neurological: Positive for speech difficulty. Negative for dizziness, weakness, light-headedness, numbness and headaches.  All other systems reviewed and are negative.    Allergies  Review of patient's allergies indicates no known allergies.  Home Medications   Current Outpatient Rx  Name  Route  Sig  Dispense  Refill  . acetaminophen (TYLENOL) 500 MG tablet   Oral   Take 500 mg by mouth 3 (three) times daily as needed for pain.         Marland Kitchen ALPRAZolam (XANAX) 1 MG tablet   Oral   Take 1 mg by mouth 3 (three) times daily as needed for anxiety.         Marland Kitchen aspirin EC 81 MG tablet   Oral   Take 81 mg by mouth daily.         . Calcium Citrate (CITRACAL PO)  Oral   Take 1 tablet by mouth daily.         Marland Kitchen FLUoxetine (PROZAC) 20 MG capsule   Oral   Take 60 mg by mouth daily.         . Multiple Vitamins-Minerals (CENTRUM SILVER PO)   Oral   Take 1 tablet by mouth daily.         . QUEtiapine (SEROQUEL) 50 MG tablet   Oral   Take 50 mg by mouth at bedtime.         . traMADol (ULTRAM) 50 MG tablet   Oral   Take 50 mg by mouth 3 (three) times daily as needed for pain.           BP 146/67  Temp(Src) 97.8 F (36.6 C) (Oral)  Ht 5\' 3"  (1.6 m)  Wt 150 lb (68.04 kg)  BMI 26.58 kg/m2  SpO2 97%  Physical Exam  Nursing note and vitals reviewed. Constitutional: She is oriented to person, place, and time. She appears well-developed and well-nourished.  HENT:   Head: Normocephalic and atraumatic.  Eyes: EOM are normal. Right conjunctiva is injected. Left conjunctiva is injected. No scleral icterus.  Neck: Normal range of motion and phonation normal. Neck supple. No JVD present. Carotid bruit is not present.  Cardiovascular: Normal rate, regular rhythm and intact distal pulses.   No murmur heard. Pulmonary/Chest: Effort normal.  Abdominal: Soft. She exhibits no distension. There is no tenderness.  Neurological: She is alert and oriented to person, place, and time. No cranial nerve deficit.  Finger to nose shows bilaterally slight past pointing, but symmetric.  Pt with some drift to right leg, but pt reports has pain in upper thigh and hip as reason she cannot keep it held.  Symmetric good strength with hip abduction and adduction.    Skin: Skin is warm.    ED Course  Procedures (including critical care time)  Labs Reviewed  COMPREHENSIVE METABOLIC PANEL - Abnormal; Notable for the following:    BUN 27 (*)    GFR calc non Af Amer 71 (*)    GFR calc Af Amer 83 (*)    All other components within normal limits  POCT I-STAT, CHEM 8 - Abnormal; Notable for the following:    BUN 27 (*)    All other components within normal limits  PROTIME-INR  APTT  CBC  DIFFERENTIAL  TROPONIN I  ETHANOL  URINE RAPID DRUG SCREEN (HOSP PERFORMED)  URINALYSIS, ROUTINE W REFLEX MICROSCOPIC  POCT I-STAT TROPONIN I   Ct Head Wo Contrast  05/23/2012  *RADIOLOGY REPORT*  Clinical Data: Difficulty with speech  CT HEAD WITHOUT CONTRAST  Technique:  Contiguous axial images were obtained from the base of the skull through the vertex without contrast.  Comparison: None.  Findings: Moderate global atrophy appropriate to age.  Chronic ischemic changes in the periventricular white matter and external capsules.  Lacunar infarct in the right thalamus.  No mass effect, midline shift, or acute intracranial hemorrhage.  Mastoid air cells are clear.  Mucosal thickening in the  ethmoid air cells is present. The no skull fracture.  IMPRESSION: Chronic ischemic changes and atrophy.   Original Report Authenticated By: Jolaine Click, M.D.      1. Stroke-like symptoms     ra sat is 97% and I interpret to be normal  11:27 AM BUN is elevated at 27 with normal Cr on I-stat 8, will give an IVF bolus.  Head CT and other stroke order set  orders are pending.    12:03 PM Spoke to Dr. Cyril Mourning with neuro who will see pt and [facility] hospitalist who agrees to admit to obs status  MDM  Pt with no significant stroke symptoms currently, certainly not a TPA candidate given no sig symptoms now, and pt unsure when last symptoms were.  Pt reports no prior stroke work up in the past, unsure when her last carotid U/S's were done.  Discussed work up, need for MRI, U/S, ECHO, admission and pt and family are agreeable.          Gavin Pound. Austyn Seier, MD 05/23/12 1203

## 2012-05-23 NOTE — ED Notes (Signed)
Pt in CT.

## 2012-05-23 NOTE — ED Notes (Signed)
BIB EMS from [facility] for further evaluation. Pt reports she has had periods of difficulty speaking over the last 2 weeks. Pt was sent by the staff and MD. Pt states she is ready to "go home"

## 2012-05-23 NOTE — Progress Notes (Signed)
EEG  Completed; results pending. 

## 2012-05-23 NOTE — ED Notes (Signed)
Pt to EEG.

## 2012-05-23 NOTE — Consult Note (Signed)
NEURO HOSPITALIST CONSULT NOTE    Reason for Consult: difficulty with speaking with question of dementia  HPI:                                                                                                                                          Jeanne Burns is an 79 y.o. female who lives at [facility]. Patient reports that she's had two episodes of difficulty expressing herself over the past two weeks. Each episode occurred only for a brief second and was separated by a prolonged period.  She is very vague about what occurred and time frames.  She also describes a sensation like "her head was moving".  This AM she spoke to the nurse at her primary care physician's office, Dr. Corbin Ade about these episodes and was told to go to the ED.  She has known right carotid stenosis 50-70% and left carotid stenosis of about 50%. Last carotid doppler 2008.  She has spoken to her primary care MD in the past about this and decided not to do anything about the stenosis.   Past Medical History  Diagnosis Date  . Depression   . Hemorrhoids, external   . Macular degeneration   . Aortic stenosis     Past Surgical History  Procedure Laterality Date  . Cholecystectomy    . Cataract extraction w/ intraocular lens  implant, bilateral    . Hip fracture surgery      Family History: Mother HTN Father HTN  Social History:  reports that she has never smoked. She does not have any smokeless tobacco history on file. She reports that she does not drink alcohol or use illicit drugs.  No Known Allergies  MEDICATIONS:                                                                                                                     Current Facility-Administered Medications  Medication Dose Route Frequency Provider Last Rate Last Dose  . sodium chloride 0.9 % bolus 500 mL  500 mL Intravenous Once Gavin Pound. Ghim, MD       Current Outpatient Prescriptions  Medication Sig Dispense  Refill  . acetaminophen (TYLENOL) 500 MG tablet Take 500 mg by mouth 3 (three) times daily as needed  for pain.      Marland Kitchen ALPRAZolam (XANAX) 1 MG tablet Take 1 mg by mouth 3 (three) times daily as needed for anxiety.      Marland Kitchen aspirin EC 81 MG tablet Take 81 mg by mouth daily.      . Calcium Citrate (CITRACAL PO) Take 1 tablet by mouth daily.      Marland Kitchen FLUoxetine (PROZAC) 20 MG capsule Take 60 mg by mouth daily.      . Multiple Vitamins-Minerals (CENTRUM SILVER PO) Take 1 tablet by mouth daily.      . QUEtiapine (SEROQUEL) 50 MG tablet Take 50 mg by mouth at bedtime.      . traMADol (ULTRAM) 50 MG tablet Take 50 mg by mouth 3 (three) times daily as needed for pain.          ROS:                                                                                                                                       History obtained from the patient  General ROS: negative for - chills, fatigue, fever, night sweats, weight gain or weight loss Psychological ROS: negative for - behavioral disorder, hallucinations, memory difficulties, mood swings or suicidal ideation Ophthalmic ROS: positive for - decreased central vision in both eyes R>L ENT ROS: negative for - epistaxis, nasal discharge, oral lesions, sore throat, tinnitus or vertigo Allergy and Immunology ROS: positive for watery eyes Hematological and Lymphatic ROS: negative for - bleeding problems, bruising or swollen lymph nodes Endocrine ROS: negative for - galactorrhea, hair pattern changes, polydipsia/polyuria or temperature intolerance Respiratory ROS: negative for - cough, hemoptysis, shortness of breath or wheezing Cardiovascular ROS: negative for - chest pain, dyspnea on exertion, edema or irregular heartbeat Gastrointestinal ROS: negative for - abdominal pain, diarrhea, hematemesis, nausea/vomiting or stool incontinence Genito-Urinary ROS: negative for - dysuria, hematuria, incontinence or urinary frequency/urgency Musculoskeletal ROS: negative  for - joint swelling or muscular weakness Neurological ROS: as noted in HPI Dermatological ROS: negative for rash and skin lesion changes   Blood pressure 146/67, temperature 97.8 F (36.6 C), temperature source Oral, height 5\' 3"  (1.6 m), weight 68.04 kg (150 lb), SpO2 97.00%.   Neurologic Examination:                                                                                                      Mental Status: Alert, oriented, thought content appropriate.  Speech fluent without evidence of aphasia.  Able to  follow 3 step commands without difficulty. Able to subtract Seriel numbers, spell [id number].   Cranial Nerves: II: Discs flat bilaterally; Visual fields grossly normal, pupils equal, round, reactive to light and accommodation III,IV, VI: ptosis not present, extra-ocular motions intact bilaterally V,VII: smile symmetric, facial light touch sensation normal bilaterally VIII: hearing normal bilaterally IX,X: gag reflex present XI: bilateral shoulder shrug XII: midline tongue extension Motor: Right : Upper extremity   5/5    Left:     Upper extremity   5/5  Lower extremity   5/5     Lower extremity   5/5 Tone and bulk:normal tone throughout; no atrophy noted Sensory: Pinprick and light touch intact throughout, bilaterally Deep Tendon Reflexes: 2+ and symmetric throughout with no AJ Plantars: Right: downgoing   Left: downgoing Cerebellar: normal finger-to-nose,  normal heel-to-shin test CV: pulses palpable throughout    No components found with this basename: cbc,  bmp,  coags,  chol,  tri,  ldl,  hga1c    Results for orders placed during the hospital encounter of 05/23/12 (from the past 48 hour(s))  PROTIME-INR     Status: None   Collection Time    05/23/12 10:34 AM      Result Value Range   Prothrombin Time 12.6  11.6 - 15.2 seconds   INR 0.95  0.00 - 1.49  APTT     Status: None   Collection Time    05/23/12 10:34 AM      Result Value Range   aPTT 32  24 - 37  seconds  CBC     Status: None   Collection Time    05/23/12 10:34 AM      Result Value Range   WBC 8.9  4.0 - 10.5 K/uL   Comment: WHITE COUNT CONFIRMED ON SMEAR   RBC 4.07  3.87 - 5.11 MIL/uL   Hemoglobin 12.6  12.0 - 15.0 g/dL   HCT 16.1  09.6 - 04.5 %   MCV 91.6  78.0 - 100.0 fL   MCH 31.0  26.0 - 34.0 pg   MCHC 33.8  30.0 - 36.0 g/dL   RDW 40.9  81.1 - 91.4 %   Platelets 202  150 - 400 K/uL   Comment: PLATELET COUNT CONFIRMED BY SMEAR     REPEATED TO VERIFY  DIFFERENTIAL     Status: None   Collection Time    05/23/12 10:34 AM      Result Value Range   Neutrophils Relative 55  43 - 77 %   Lymphocytes Relative 36  12 - 46 %   Monocytes Relative 9  3 - 12 %   Eosinophils Relative 0  0 - 5 %   Basophils Relative 0  0 - 1 %   Neutro Abs 4.9  1.7 - 7.7 K/uL   Lymphs Abs 3.2  0.7 - 4.0 K/uL   Monocytes Absolute 0.8  0.1 - 1.0 K/uL   Eosinophils Absolute 0.0  0.0 - 0.7 K/uL   Basophils Absolute 0.0  0.0 - 0.1 K/uL   Smear Review MORPHOLOGY UNREMARKABLE    COMPREHENSIVE METABOLIC PANEL     Status: Abnormal   Collection Time    05/23/12 10:34 AM      Result Value Range   Sodium 140  135 - 145 mEq/L   Potassium 4.2  3.5 - 5.1 mEq/L   Chloride 102  96 - 112 mEq/L   CO2 30  19 - 32 mEq/L   Glucose, Bld 74  70 - 99 mg/dL   BUN 27 (*) 6 - 23 mg/dL   Creatinine, Ser 4.09  0.50 - 1.10 mg/dL   Calcium 9.2  8.4 - 81.1 mg/dL   Total Protein 7.0  6.0 - 8.3 g/dL   Albumin 3.8  3.5 - 5.2 g/dL   AST 26  0 - 37 U/L   ALT 28  0 - 35 U/L   Alkaline Phosphatase 57  39 - 117 U/L   Total Bilirubin 0.7  0.3 - 1.2 mg/dL   GFR calc non Af Amer 71 (*) >90 mL/min   GFR calc Af Amer 83 (*) >90 mL/min   Comment:            The eGFR has been calculated     using the CKD EPI equation.     This calculation has not been     validated in all clinical     situations.     eGFR's persistently     <90 mL/min signify     possible Chronic Kidney Disease.  ETHANOL     Status: None   Collection Time     05/23/12 10:34 AM      Result Value Range   Alcohol, Ethyl (B) <11  0 - 11 mg/dL   Comment:            LOWEST DETECTABLE LIMIT FOR     SERUM ALCOHOL IS 11 mg/dL     FOR MEDICAL PURPOSES ONLY  TROPONIN I     Status: None   Collection Time    05/23/12 10:35 AM      Result Value Range   Troponin I <0.30  <0.30 ng/mL   Comment:            Due to the release kinetics of cTnI,     a negative result within the first hours     of the onset of symptoms does not rule out     myocardial infarction with certainty.     If myocardial infarction is still suspected,     repeat the test at appropriate intervals.  POCT I-STAT TROPONIN I     Status: None   Collection Time    05/23/12 10:56 AM      Result Value Range   Troponin i, poc 0.00  0.00 - 0.08 ng/mL   Comment 3            Comment: Due to the release kinetics of cTnI,     a negative result within the first hours     of the onset of symptoms does not rule out     myocardial infarction with certainty.     If myocardial infarction is still suspected,     repeat the test at appropriate intervals.  POCT I-STAT, CHEM 8     Status: Abnormal   Collection Time    05/23/12 10:58 AM      Result Value Range   Sodium 140  135 - 145 mEq/L   Potassium 4.2  3.5 - 5.1 mEq/L   Chloride 103  96 - 112 mEq/L   BUN 27 (*) 6 - 23 mg/dL   Creatinine, Ser 9.14  0.50 - 1.10 mg/dL   Glucose, Bld 75  70 - 99 mg/dL   Calcium, Ion 7.82  9.56 - 1.30 mmol/L   TCO2 29  0 - 100 mmol/L   Hemoglobin 13.3  12.0 - 15.0 g/dL   HCT 21.3  08.6 - 57.8 %  URINALYSIS, ROUTINE W REFLEX MICROSCOPIC     Status: Abnormal   Collection Time    05/23/12 12:06 PM      Result Value Range   Color, Urine YELLOW  YELLOW   APPearance CLOUDY (*) CLEAR   Specific Gravity, Urine 1.019  1.005 - 1.030   pH 8.0  5.0 - 8.0   Glucose, UA NEGATIVE  NEGATIVE mg/dL   Hgb urine dipstick NEGATIVE  NEGATIVE   Bilirubin Urine NEGATIVE  NEGATIVE   Ketones, ur NEGATIVE  NEGATIVE mg/dL    Protein, ur NEGATIVE  NEGATIVE mg/dL   Urobilinogen, UA 0.2  0.0 - 1.0 mg/dL   Nitrite NEGATIVE  NEGATIVE   Leukocytes, UA SMALL (*) NEGATIVE  URINE MICROSCOPIC-ADD ON     Status: Abnormal   Collection Time    05/23/12 12:06 PM      Result Value Range   Squamous Epithelial / LPF RARE  RARE   WBC, UA 0-2  <3 WBC/hpf   RBC / HPF 0-2  <3 RBC/hpf   Bacteria, UA RARE  RARE   Casts GRANULAR CAST (*) NEGATIVE   Urine-Other AMORPHOUS URATES/PHOSPHATES      Ct Head Wo Contrast  05/23/2012  *RADIOLOGY REPORT*  Clinical Data: Difficulty with speech  CT HEAD WITHOUT CONTRAST  Technique:  Contiguous axial images were obtained from the base of the skull through the vertex without contrast.  Comparison: None.  Findings: Moderate global atrophy appropriate to age.  Chronic ischemic changes in the periventricular white matter and external capsules.  Lacunar infarct in the right thalamus.  No mass effect, midline shift, or acute intracranial hemorrhage.  Mastoid air cells are clear.  Mucosal thickening in the ethmoid air cells is present. The no skull fracture.  IMPRESSION: Chronic ischemic changes and atrophy.   Original Report Authenticated By: Jolaine Click, M.D.      Assessment/Plan: 79 YO female with two episodes of expressive aphasia with no other neurological symptoms.  Etiology is unclear at this time, however, given her known carotid stenosis and age  Cannot exclude TIA or seizure.   Recommend: 1) EEG 2) MRI/MRA head if MRI is positive continue with stroke workup      Assessment and plan discussed with with attending physician and they are in agreement.    Felicie Morn PA-C [facility] [phone]  05/23/2012, 12:45 PM  Patient seen and examined together with physician assistant and I concur with the assessment and plan.  Wyatt Portela, MD

## 2012-05-24 DIAGNOSIS — F809 Developmental disorder of speech and language, unspecified: Secondary | ICD-10-CM

## 2012-05-24 LAB — HEMOGLOBIN A1C
Hgb A1c MFr Bld: 6.2 % — ABNORMAL HIGH (ref <5.7)
Mean Plasma Glucose: 131 mg/dL — ABNORMAL HIGH (ref <117)

## 2012-05-24 LAB — LIPID PANEL: Total CHOL/HDL Ratio: 3.2 RATIO

## 2012-05-24 MED ORDER — ATORVASTATIN CALCIUM 20 MG PO TABS
20.0000 mg | ORAL_TABLET | Freq: Every day | ORAL | Status: DC
  Filled 2012-05-24: qty 1

## 2012-05-24 MED ORDER — ASPIRIN 325 MG PO TABS
325.0000 mg | ORAL_TABLET | Freq: Every day | ORAL | Status: DC
  Administered 2012-05-24: 325 mg via ORAL
  Filled 2012-05-24: qty 1

## 2012-05-24 MED ORDER — ONDANSETRON HCL 4 MG/2ML IJ SOLN: 4.0000 mg | INTRAMUSCULAR | Status: DC | PRN

## 2012-05-24 MED ORDER — MECLIZINE HCL 12.5 MG PO TABS: 12.5000 mg | ORAL_TABLET | Freq: Two times a day (BID) | ORAL | Status: DC | PRN

## 2012-05-24 MED ORDER — MECLIZINE HCL 12.5 MG PO TABS
12.5000 mg | ORAL_TABLET | Freq: Two times a day (BID) | ORAL | Status: DC
  Filled 2012-05-24 (×2): qty 1

## 2012-05-24 MED ORDER — ATORVASTATIN CALCIUM 20 MG PO TABS: 20.0000 mg | ORAL_TABLET | Freq: Every day | ORAL | Status: DC

## 2012-05-24 NOTE — Progress Notes (Signed)
Pt. Refused any stroke teaching; states "I'm never coming back to the hospital again. I am not calling 911 again." Pt. Is alert and oriented x4. Pt. Discharged with family.

## 2012-05-24 NOTE — Procedures (Signed)
EEG report.  Brief clinical history: 79  years old female with episodes of very transient language impairment. No prior history of frank epileptic seizures.  Technique: this is a 17 channel routine scalp EEG performed at the bedside with bipolar and monopolar montages arranged in accordance to the international 10/20 system of electrode placement. One channel was dedicated to EKG recording.  The study was performed during wakefulness and no sleep was achieved. No activating procedures employed during the test.  Description:In the wakeful state, the best background consisted of a medium amplitude, posterior dominant, well sustained, symmetric and reactive 10 Hz rhythm. No focal or generalized epileptiform discharges noted.  No slowing seen.  EKG showed sinus rhythm.  Impression: this is a normal awake EEG. Please, be aware that a normal EEG does not exclude the possibility of epilepsy.  Clinical correlation is advised.  Wyatt Portela, MD

## 2012-05-24 NOTE — Discharge Summary (Signed)
Physician Discharge Summary  Patient ID: Jeanne Burns MRN: [id number] DOB/AGE: 08/19/1915 79 y.o.  Admit date: 05/23/2012 Discharge date: 05/24/2012  Primary Care Physician:  Ginette Otto, MD  Discharge Diagnoses:   2 episodes of expressive aphasia, resolved Known history of carotid stenosis Aortic stenosis Macular degeneration Hyperlipidemia- LDL 123, cholesterol 29   Consults: Neurology, Dr. Cyril Mourning   Discharge Medications:   Medication List    TAKE these medications       acetaminophen 500 MG tablet  Commonly known as:  TYLENOL  Take 1,000 mg by mouth 3 (three) times daily.     ALPRAZolam 1 MG tablet  Commonly known as:  XANAX  Take 1 mg by mouth 3 (three) times daily.     aspirin EC 81 MG tablet  Take 81 mg by mouth daily.     CITRACAL + D PO  Take 1-2 tablets by mouth 2 (two) times daily. Calcium 600 mg/Vitamin D. 400 i.u. (2 tablets at lunch, 1 tablet at supper)     Fish Oil 600 MG Caps  Take 1,200 mg by mouth daily.     FLUoxetine 20 MG capsule  Commonly known as:  PROZAC  Take 40 mg by mouth daily.     multivitamin with minerals Tabs  Take 1 tablet by mouth daily. Centrum Silver     QUEtiapine 50 MG tablet  Commonly known as:  SEROQUEL  Take 50 mg by mouth at bedtime.     SYSTANE OP  Place 1 drop into both eyes as needed (for dry eyes).     traMADol 50 MG tablet  Commonly known as:  ULTRAM  Take 50 mg by mouth 3 (three) times daily.         Brief H and P: For complete details please refer to admission H and P, but in brief patient is a 79 year old female who presented with word finding difficulty. She had noted that over the past couple of weeks she had 2 episodes where she was talking on the phone and had difficulty saying the word or it came out wrong. These episodes only lasted for a second or 2 and were separated by days. She also described a funny sensation in her head. She spoke to her PCP, Dr. Pete Glatter and was advised to come  to the ED. She has known history of carotid artery stenosis but did not want any further workup.   Hospital Course:  Patient is a 79 year old female with 2 episodes of expressive aphasia but no other neurological symptoms. She has a known history of carotid stenosis. Patient was admitted for observation for possible TIA or seizure. Neurology was consulted, patient underwent MRI of the brain which showed no acute infarct. MRA was negative for any large vessel occlusion or stenosis. Patient also underwent EEG, which which was normal. Patient has a known history of carotid stenosis, right 50-70%, left 50%, patient has decided not to do anything about the stenosis, this was confirmed with the family and she has spoken to her primary M.D. in the past. Carotid Dopplers and echocardiogram were not repeated.  Lipid panel showed LDL of 123, cholesterol 209, patient declined statins as they make her unsteady. Patient was adamant about being discharged today. Patient was cleared by Neurology, Dr Leroy Kennedy for discharge.  PT evaluation was canceled as patient was ambulating in the hallway.  Day of Discharge BP 153/58  Pulse 89  Temp(Src) 97.8 F (36.6 C) (Oral)  Resp 17  Ht 5\' 3"  (1.6 m)  Wt 68.04 kg (150 lb)  BMI 26.58 kg/m2  SpO2 96%  Physical Exam: General: Alert and awake oriented x3 not in any acute distress. HEENT: anicteric sclera, pupils reactive to light and accommodation CVS: S1-S2 clear, 2-3/6 SEM Chest: clear to auscultation bilaterally, no wheezing rales or rhonchi Abdomen: soft nontender, nondistended, normal bowel sounds Extremities: no cyanosis, clubbing or edema noted bilaterally Neuro: Cranial nerves II-XII intact, no focal neurological deficits   The results of significant diagnostics from this hospitalization (including imaging, microbiology, ancillary and laboratory) are listed below for reference.    LAB RESULTS: Basic Metabolic Panel:  Recent Labs Lab 05/23/12 1034  05/23/12 1058 05/23/12 1850  NA 140 140  --   K 4.2 4.2  --   CL 102 103  --   CO2 30  --   --   GLUCOSE 74 75  --   BUN 27* 27*  --   CREATININE 0.72 0.80 0.64  CALCIUM 9.2  --   --    Liver Function Tests:  Recent Labs Lab 05/23/12 1034  AST 26  ALT 28  ALKPHOS 57  BILITOT 0.7  PROT 7.0  ALBUMIN 3.8   No results found for this basename: LIPASE, AMYLASE,  in the last 168 hours No results found for this basename: AMMONIA,  in the last 168 hours CBC:  Recent Labs Lab 05/23/12 1034 05/23/12 1058 05/23/12 1850  WBC 8.9  --  8.2  NEUTROABS 4.9  --   --   HGB 12.6 13.3 12.7  HCT 37.3 39.0 38.0  MCV 91.6  --  92.9  PLT 202  --  208   Cardiac Enzymes:  Recent Labs Lab 05/23/12 1035  TROPONINI <0.30   BNP: No components found with this basename: POCBNP,  CBG: No results found for this basename: GLUCAP,  in the last 168 hours  Significant Diagnostic Studies:  Ct Head Wo Contrast  05/23/2012  *RADIOLOGY REPORT*  Clinical Data: Difficulty with speech  CT HEAD WITHOUT CONTRAST  Technique:  Contiguous axial images were obtained from the base of the skull through the vertex without contrast.  Comparison: None.  Findings: Moderate global atrophy appropriate to age.  Chronic ischemic changes in the periventricular white matter and external capsules.  Lacunar infarct in the right thalamus.  No mass effect, midline shift, or acute intracranial hemorrhage.  Mastoid air cells are clear.  Mucosal thickening in the ethmoid air cells is present. The no skull fracture.  IMPRESSION: Chronic ischemic changes and atrophy.   Original Report Authenticated By: Jolaine Click, M.D.    Mr [facility] Wo Contrast  05/23/2012  *RADIOLOGY REPORT*  Clinical Data:  Stroke.  Word finding difficulty  MRI HEAD WITHOUT CONTRAST MRA HEAD WITHOUT CONTRAST  Technique:  Multiplanar, multiecho pulse sequences of the brain and surrounding structures were obtained without intravenous contrast. Angiographic images  of the head were obtained using MRA technique without contrast.  Comparison:  CT 05/23/2012  MRI HEAD  Findings:  Negative for acute infarct.  Generalized atrophy.  Chronic microvascular ischemic change in the white matter and pons.  No cortical infarct.  Negative for hemorrhage or mass lesion.  No fluid collection or midline shift.  Paranasal sinuses are clear.  IMPRESSION: Atrophy and chronic microvascular ischemia.  No acute abnormality.  MRA HEAD  Findings: Both vertebral arteries are patent to the basilar.  PICA patent bilaterally.  The basilar is widely patent.  The posterior cerebral arteries are widely patent bilaterally.  Internal carotid artery  is patent bilaterally without stenosis. Anterior and middle cerebral arteries are widely patent bilaterally.  Negative for aneurysm.  IMPRESSION: Negative   Original Report Authenticated By: Janeece Riggers, M.D.    Mr Brain Wo Contrast  05/23/2012  *RADIOLOGY REPORT*  Clinical Data:  Stroke.  Word finding difficulty  MRI HEAD WITHOUT CONTRAST MRA HEAD WITHOUT CONTRAST  Technique:  Multiplanar, multiecho pulse sequences of the brain and surrounding structures were obtained without intravenous contrast. Angiographic images of the head were obtained using MRA technique without contrast.  Comparison:  CT 05/23/2012  MRI HEAD  Findings:  Negative for acute infarct.  Generalized atrophy.  Chronic microvascular ischemic change in the white matter and pons.  No cortical infarct.  Negative for hemorrhage or mass lesion.  No fluid collection or midline shift.  Paranasal sinuses are clear.  IMPRESSION: Atrophy and chronic microvascular ischemia.  No acute abnormality.  MRA HEAD  Findings: Both vertebral arteries are patent to the basilar.  PICA patent bilaterally.  The basilar is widely patent.  The posterior cerebral arteries are widely patent bilaterally.  Internal carotid artery is patent bilaterally without stenosis. Anterior and middle cerebral arteries are widely patent  bilaterally.  Negative for aneurysm.  IMPRESSION: Negative   Original Report Authenticated By: Janeece Riggers, M.D.        Disposition and Follow-up: Discharge Orders   Future Orders Complete By Expires     Diet - low sodium heart healthy  As directed     Increase activity slowly  As directed         DISPOSITION: Assisted-living facility DIET: Heart healthy diet  ACTIVITY: As tolerated   DISCHARGE FOLLOW-UP Follow-up Information   Follow up with Ginette Otto, MD. Schedule an appointment as soon as possible for a visit in 2 weeks. (for follow-up)    Contact information:   [address] [place] [address] [phone]       Time spent on Discharge: 35 minutes  Signed:   RAI,RIPUDEEP M.D. [facility] 05/24/2012, 1:27 PM Pager: [phone]

## 2012-05-24 NOTE — Progress Notes (Signed)
Utilization Review Completed.   Matthew Cina, RN, BSN Nurse Case Manager  [phone]  

## 2012-05-24 NOTE — Progress Notes (Signed)
CSW was contacted via nurse concerning d/c planning to ALF. CSW contacted [facility] and Pt is in the [facility] Living [facility] at their facility and can return when medically ready for d/c. Nursing staff notified.   CSW will assist in arranging travel via family if needed.   No further needs at this time.   Leron Croak, LCSWA [facility].  [phone]

## 2012-05-24 NOTE — Progress Notes (Signed)
Discussed patient with Dr Leroy Kennedy. MRI negative for acute findings. Final results of EEG are pending at this time. Per Dr. Cyril Mourning okay to proceed with discharge.  Delton See PA-C [facility] Pager [phone] 05/24/2012, 10:39 AM

## 2013-04-16 ENCOUNTER — Ambulatory Visit (INDEPENDENT_AMBULATORY_CARE_PROVIDER_SITE_OTHER): Payer: Medicare Other | Admitting: Interventional Cardiology

## 2013-04-16 ENCOUNTER — Encounter: Payer: Self-pay | Admitting: Interventional Cardiology

## 2013-04-16 VITALS — BP 126/64 | HR 83 | Ht 63.0 in | Wt 147.8 lb

## 2013-04-16 DIAGNOSIS — I350 Nonrheumatic aortic (valve) stenosis: Secondary | ICD-10-CM | POA: Insufficient documentation

## 2013-04-16 DIAGNOSIS — I6529 Occlusion and stenosis of unspecified carotid artery: Secondary | ICD-10-CM

## 2013-04-16 DIAGNOSIS — I658 Occlusion and stenosis of other precerebral arteries: Secondary | ICD-10-CM

## 2013-04-16 DIAGNOSIS — I10 Essential (primary) hypertension: Secondary | ICD-10-CM | POA: Insufficient documentation

## 2013-04-16 DIAGNOSIS — I359 Nonrheumatic aortic valve disorder, unspecified: Secondary | ICD-10-CM

## 2013-04-16 DIAGNOSIS — I6523 Occlusion and stenosis of bilateral carotid arteries: Secondary | ICD-10-CM | POA: Insufficient documentation

## 2013-04-16 DIAGNOSIS — H3530 Unspecified macular degeneration: Secondary | ICD-10-CM

## 2013-04-16 NOTE — Progress Notes (Signed)
Patient ID: Jeanne Burns, female   DOB: 23-Mar-1915, 80 y.o.   MRN: [id number] Past Medical History  Hypertension-whitecoat   Allergic rhinitis   Depression   Mitral regurg   Aortic stenosis-severe on echo 11/03/2009 AVA .75 cm2. Declines any surgical intervention see telephone message   Right carotid stenosis 70% left 50%   hypercholesterolemia goal LDL less than 70   Refused colonoscopy in the past   macular degeneration Dr Baird Cancer   cardiology-Dr. Tamala Julian   L3 compresion frx   Back pain improved after 2 epidural steroid 06/2010      [address] [place], [place]  [address] Phone: [phone] Fax:  [phone]  Date:  04/16/2013   ID:  Jeanne Burns, DOB 1915-04-26, MRN [id number]  PCP:  Mathews Argyle, MD   ASSESSMENT:  1. Critical aortic stenosis 2. Angina pectoris  3. Elderly and frail 4. Hypertension 5. Carotid stenosis, left   PLAN:  1. The patient is critical aortic stenosis and possibly underlying coronary disease. We discussed the natural history of aortic stenosis and the truncated survival after the onset of symptoms. The patient reaffirmed that she desires nothing but medical therapy. She would not have surgery or TAVR for aortic valve disease. She would not allow coronary angiography. 2. Should she develop intractable angina or heart disease symptoms, adjustment of medical regimen may be helpful. 3. No formal clinical followup is given although I be happy to see her in the future if symptoms occur, to initiate a medical regimen    SUBJECTIVE: Jeanne Burns is a 80 y.o. female who had 2 episodes of chest discomfort in the lower sternal region. The episodes occurred at rest. They were a sensation of tightness. Each lasted less than 5 minutes and resolved spontaneously. No recurrence of discomfort now for several weeks. She denies dyspnea. She has not had syncope.  The patient had critical aortic stenosis documented by  echocardiography in 2011. Valve area at that time was calculated to be 0.75 cm.   Wt Readings from Last 3 Encounters:  04/16/13 147 lb 12.8 oz (67.042 kg)  05/23/12 150 lb (68.04 kg)     Past Medical History  Diagnosis Date  . Depression   . Hemorrhoids, external   . Macular degeneration   . Aortic stenosis     Severe on ECHO 11/03/09 AVA .75 cm2  . Insomnia   . Anxiety   . Hip fracture, left 07/2005    "no surgery; it's completely healed" &/notes 08/02/2005 (05/23/2012)  . Heart murmur     "since I was a child" (05/23/2012)  . Arthritis     "it's beginning" (05/23/2012)  . Chronic lower back pain   . Skin cancer of face     "under left side of nose" (05/23/2012)  . Expressive aphasia     "couple times recently" (05/23/2012)  . Mitral regurgitation   . Carotid stenosis     Right 70%, Left 50%  . Hypercholesterolemia     Goal LDL less tha 70  . Mandibular mass 2001    Left  . Chest discomfort     No further chest discomfort since  06/09/10  . Hypertension   . Allergic rhinitis     Current Outpatient Prescriptions  Medication Sig Dispense Refill  . acetaminophen (TYLENOL) 500 MG tablet Take 1,000 mg by mouth 3 (three) times daily.      Marland Kitchen ALPRAZolam (XANAX) 1 MG tablet Take 1 mg by mouth 3 (three)  times daily.      . Calcium Citrate-Vitamin D (CITRACAL + D PO) Take 1-2 tablets by mouth 2 (two) times daily. Calcium 600 mg/Vitamin D. 400 i.u. (2 tablets at lunch, 1 tablet at supper)      . FLUoxetine (PROZAC) 20 MG capsule Take 40 mg by mouth 2 (two) times daily.       . Multiple Vitamin (MULTIVITAMIN WITH MINERALS) TABS Take 1 tablet by mouth daily. Centrum Silver      . Omega-3 Fatty Acids (FISH OIL) 600 MG CAPS Take 1,200 mg by mouth daily.      Vladimir Faster Glycol-Propyl Glycol (SYSTANE OP) Place 1 drop into both eyes as needed (for dry eyes).      . QUEtiapine (SEROQUEL) 50 MG tablet Take 50 mg by mouth at bedtime.      . traMADol (ULTRAM) 50 MG tablet Take 50 mg by mouth 3  (three) times daily.       No current facility-administered medications for this visit.    Allergies:    Allergies  Allergen Reactions  . Gabapentin   . Other Other (See Comments)    Nail polish makes nails crack and bleed    Social History:  The patient  reports that she quit smoking about 35 years ago. Her smoking use included Cigarettes. She smoked 0.00 packs per day for 20 years. She has never used smokeless tobacco. She reports that she does not drink alcohol or use illicit drugs.   ROS:  Please see the history of present illness.   Appetite is good. No orthopnea. No edema.   All other systems reviewed and negative.   OBJECTIVE: VS:  BP 126/64  Pulse 83  Ht 5\' 3"  (1.6 m)  Wt 147 lb 12.8 oz (67.042 kg)  BMI 26.19 kg/m2 Well nourished, well developed, in no acute distress, elderly but appears than stated age 66: normal Neck: JVD flat. Carotid bruit bilateral transmitted from the aortic valve  Cardiac:  normal S1, S2; RRR;  3/6 systolic ejection murmur Lungs:  clear to auscultation bilaterally, no wheezing, rhonchi or rales Abd: soft, nontender, no hepatomegaly Ext: Edema absent. Pulses 2+ bilateral Skin: warm and dry Neuro:  CNs 2-12 intact, no focal abnormalities noted  EKG:  Normal sinus rhythm with prominent voltage compatible with LVH       Signed, Illene Labrador III, MD 04/16/2013 3:45 PM  Past Medical History  Hypertension-whitecoat   Allergic rhinitis   Depression   Mitral regurg   Aortic stenosis-severe on echo 11/03/2009 AVA .75 cm2. Declines any surgical intervention see telephone message   Right carotid stenosis 70% left 50%   hypercholesterolemia goal LDL less than 70   Refused colonoscopy in the past   macular degeneration Dr Baird Cancer

## 2013-04-16 NOTE — Patient Instructions (Signed)
Your physician recommends that you continue on your current medications as directed. Please refer to the Current Medication list given to you today.  Your physician recommends that you schedule a follow-up appointment as needed  Call the office if your are having prolonged chest pain [phone]

## 2013-06-30 ENCOUNTER — Emergency Department (HOSPITAL_COMMUNITY)
Admission: EM | Admit: 2013-06-30 | Discharge: 2013-06-30 | Disposition: A | Discharge status: Home / Self Care | Payer: Medicare Other | Attending: Emergency Medicine | Admitting: Emergency Medicine

## 2013-06-30 ENCOUNTER — Encounter (HOSPITAL_COMMUNITY): Payer: Self-pay | Admitting: Emergency Medicine

## 2013-06-30 DIAGNOSIS — Z8781 Personal history of (healed) traumatic fracture: Secondary | ICD-10-CM | POA: Insufficient documentation

## 2013-06-30 DIAGNOSIS — I10 Essential (primary) hypertension: Secondary | ICD-10-CM | POA: Insufficient documentation

## 2013-06-30 DIAGNOSIS — F3289 Other specified depressive episodes: Secondary | ICD-10-CM | POA: Insufficient documentation

## 2013-06-30 DIAGNOSIS — Z8669 Personal history of other diseases of the nervous system and sense organs: Secondary | ICD-10-CM | POA: Insufficient documentation

## 2013-06-30 DIAGNOSIS — Z85828 Personal history of other malignant neoplasm of skin: Secondary | ICD-10-CM | POA: Insufficient documentation

## 2013-06-30 DIAGNOSIS — R011 Cardiac murmur, unspecified: Secondary | ICD-10-CM | POA: Insufficient documentation

## 2013-06-30 DIAGNOSIS — G8929 Other chronic pain: Secondary | ICD-10-CM | POA: Insufficient documentation

## 2013-06-30 DIAGNOSIS — Z79899 Other long term (current) drug therapy: Secondary | ICD-10-CM | POA: Insufficient documentation

## 2013-06-30 DIAGNOSIS — R42 Dizziness and giddiness: Secondary | ICD-10-CM | POA: Insufficient documentation

## 2013-06-30 DIAGNOSIS — Z87891 Personal history of nicotine dependence: Secondary | ICD-10-CM | POA: Insufficient documentation

## 2013-06-30 DIAGNOSIS — F411 Generalized anxiety disorder: Secondary | ICD-10-CM | POA: Insufficient documentation

## 2013-06-30 DIAGNOSIS — Z8709 Personal history of other diseases of the respiratory system: Secondary | ICD-10-CM | POA: Insufficient documentation

## 2013-06-30 DIAGNOSIS — Z8739 Personal history of other diseases of the musculoskeletal system and connective tissue: Secondary | ICD-10-CM | POA: Insufficient documentation

## 2013-06-30 DIAGNOSIS — R197 Diarrhea, unspecified: Secondary | ICD-10-CM | POA: Insufficient documentation

## 2013-06-30 DIAGNOSIS — Z8639 Personal history of other endocrine, nutritional and metabolic disease: Secondary | ICD-10-CM | POA: Insufficient documentation

## 2013-06-30 DIAGNOSIS — F329 Major depressive disorder, single episode, unspecified: Secondary | ICD-10-CM | POA: Insufficient documentation

## 2013-06-30 DIAGNOSIS — Z862 Personal history of diseases of the blood and blood-forming organs and certain disorders involving the immune mechanism: Secondary | ICD-10-CM | POA: Insufficient documentation

## 2013-06-30 LAB — CBC
HCT: 39.4 % (ref 36.0–46.0)
Hemoglobin: 13 g/dL (ref 12.0–15.0)
MCH: 31.2 pg (ref 26.0–34.0)
MCHC: 33 g/dL (ref 30.0–36.0)
MCV: 94.5 fL (ref 78.0–100.0)
PLATELETS: 199 10*3/uL (ref 150–400)
RBC: 4.17 MIL/uL (ref 3.87–5.11)
RDW: 14.2 % (ref 11.5–15.5)
WBC: 8 10*3/uL (ref 4.0–10.5)

## 2013-06-30 LAB — BASIC METABOLIC PANEL
BUN: 26 mg/dL — ABNORMAL HIGH (ref 6–23)
CALCIUM: 9.7 mg/dL (ref 8.4–10.5)
CO2: 25 mEq/L (ref 19–32)
Chloride: 100 mEq/L (ref 96–112)
Creatinine, Ser: 0.7 mg/dL (ref 0.50–1.10)
GFR, EST AFRICAN AMERICAN: 83 mL/min — AB (ref >90)
GFR, EST NON AFRICAN AMERICAN: 71 mL/min — AB (ref >90)
GLUCOSE: 86 mg/dL (ref 70–99)
Potassium: 4.3 mEq/L (ref 3.7–5.3)
SODIUM: 139 meq/L (ref 137–147)

## 2013-06-30 LAB — POC OCCULT BLOOD, ED: FECAL OCCULT BLD: POSITIVE — AB

## 2013-06-30 LAB — I-STAT CG4 LACTIC ACID, ED: LACTIC ACID, VENOUS: 1.13 mmol/L (ref 0.5–2.2)

## 2013-06-30 NOTE — Discharge Instructions (Signed)

## 2013-06-30 NOTE — ED Notes (Signed)
Per EMS- Patient called c/o dizziness and diarrhea. Patient told EMS that she stood with her walker and had dizziness. Patient states she sat down on her bed and the dizziness went away. Patient stated she then had an episode of diarrhea. Dizziness has not returned and no further diarrhea. patient was concerned and wanted to come to the ED.

## 2013-06-30 NOTE — ED Provider Notes (Signed)
CSN: [id number]     Arrival date & time 06/30/13  1053 History   First MD Initiated Contact with Patient 06/30/13 1100     Chief Complaint  Patient presents with  . Dizziness  . Diarrhea     (Consider location/radiation/quality/duration/timing/severity/associated sxs/prior Treatment) Patient is a 80 y.o. female presenting with dizziness and diarrhea. The history is provided by the patient.  Dizziness Quality:  Head spinning Severity:  Mild Onset quality:  Sudden Duration: 10 min. Timing:  Intermittent Progression:  Resolved Chronicity:  New Context: standing up   Relieved by:  Being still (sitting down) Worsened by:  Nothing tried Ineffective treatments:  None tried Associated symptoms: diarrhea   Associated symptoms: no blood in stool, no chest pain, no hearing loss, no nausea, no shortness of breath, no syncope, no vision changes, no vomiting and no weakness   Diarrhea:    Quality:  Watery   Number of occurrences:  1   Severity:  Moderate   Diarrhea timing: once. Risk factors: no anemia and no hx of stroke   Diarrhea Associated symptoms: no chills, no fever and no vomiting     Past Medical History  Diagnosis Date  . Depression   . Hemorrhoids, external   . Macular degeneration   . Aortic stenosis     Severe on ECHO 11/03/09 AVA .75 cm2  . Insomnia   . Anxiety   . Hip fracture, left 07/2005    "no surgery; it's completely healed" &/notes 08/02/2005 (05/23/2012)  . Heart murmur     "since I was a child" (05/23/2012)  . Arthritis     "it's beginning" (05/23/2012)  . Chronic lower back pain   . Skin cancer of face     "under left side of nose" (05/23/2012)  . Expressive aphasia     "couple times recently" (05/23/2012)  . Mitral regurgitation   . Carotid stenosis     Right 70%, Left 50%  . Hypercholesterolemia     Goal LDL less tha 70  . Mandibular mass 2001    Left  . Chest discomfort     No further chest discomfort since  06/09/10  . Hypertension   . Allergic  rhinitis    Past Surgical History  Procedure Laterality Date  . Cholecystectomy    . Cataract extraction w/ intraocular lens  implant, bilateral    . Submandibular gland excision Left 05/2010    "infected; taken out" (05/23/2012)  . Skin cancer excision  2010    "not long ago; off left side under my nose" (05/23/2012) Basal cell  . Tonsillectomy Left   . Breast biopsy      Benign  . Septoplasty    . Orif hip fracture Left 1997  . Dilation and curettage of uterus     Family History  Problem Relation Age of Onset  . Congestive Heart Failure Mother   . Macular degeneration Mother   . CVA Father   . Heart attack Sister   . Macular degeneration Sister   . Heart disease Brother   . Arthritis Brother    History  Substance Use Topics  . Smoking status: Former Smoker -- 20 years    Types: Cigarettes    Quit date: 01/29/1978  . Smokeless tobacco: Never Used     Comment: 05/23/2012 'quit smoking in my 20's"  . Alcohol Use: No   OB History   Grav Para Term Preterm Abortions TAB SAB Ect Mult Living  Review of Systems  Constitutional: Negative for fever and chills.  HENT: Negative for hearing loss.   Respiratory: Negative for shortness of breath.   Cardiovascular: Negative for chest pain and syncope.  Gastrointestinal: Positive for diarrhea. Negative for nausea, vomiting and blood in stool.  Neurological: Positive for dizziness.  All other systems reviewed and are negative.     Allergies  Gabapentin and Other  Home Medications   Prior to Admission medications   Medication Sig Start Date End Date Taking? Authorizing Provider  acetaminophen (TYLENOL) 500 MG tablet Take 1,000 mg by mouth 3 (three) times daily.   Yes Historical Provider, MD  ALPRAZolam Duanne Moron) 1 MG tablet Take 1 mg by mouth 3 (three) times daily.   Yes Historical Provider, MD  Calcium Citrate-Vitamin D (CITRACAL + D PO) Take 1-2 tablets by mouth 2 (two) times daily. Calcium 600 mg/Vitamin D. 400  i.u. (2 tablets at lunch, 1 tablet at supper)   Yes Historical Provider, MD  FLUoxetine (PROZAC) 20 MG capsule Take 40 mg by mouth daily.    Yes Historical Provider, MD  Multiple Vitamin (MULTIVITAMIN WITH MINERALS) TABS Take 1 tablet by mouth daily. Centrum Silver   Yes Historical Provider, MD  Omega-3 Fatty Acids (FISH OIL) 600 MG CAPS Take 1,200 mg by mouth daily.   Yes Historical Provider, MD  Polyethyl Glycol-Propyl Glycol (SYSTANE OP) Place 1 drop into both eyes as needed (for dry eyes).   Yes Historical Provider, MD  QUEtiapine (SEROQUEL) 50 MG tablet Take 50 mg by mouth at bedtime.   Yes Historical Provider, MD  traMADol (ULTRAM) 50 MG tablet Take 50 mg by mouth 3 (three) times daily.   Yes Historical Provider, MD   BP 149/50  Pulse 82  Temp(Src) 97.7 F (36.5 C) (Oral)  Resp 19  SpO2 96% Physical Exam  Nursing note and vitals reviewed. Constitutional: She is oriented to person, place, and time. She appears well-developed and well-nourished. No distress.  HENT:  Head: Normocephalic and atraumatic.  Eyes: EOM are normal. Pupils are equal, round, and reactive to light.  Neck: Normal range of motion. Neck supple.  Cardiovascular: Normal rate and regular rhythm.  Exam reveals no friction rub.   No murmur heard. Pulmonary/Chest: Effort normal and breath sounds normal. No respiratory distress. She has no wheezes. She has no rales.  Abdominal: Soft. She exhibits no distension. There is no tenderness. There is no rebound.  Genitourinary: Guaiac positive stool.  Musculoskeletal: Normal range of motion. She exhibits no edema.  Neurological: She is alert and oriented to person, place, and time. No cranial nerve deficit. She exhibits normal muscle tone. Coordination normal.  Skin: No rash noted. She is not diaphoretic.    ED Course  Procedures (including critical care time) Labs Review Labs Reviewed  POC OCCULT BLOOD, ED - Abnormal; Notable for the following:    Fecal Occult Bld  POSITIVE (*)    All other components within normal limits  CBC  BASIC METABOLIC PANEL  I-STAT CG4 LACTIC ACID, ED    Imaging Review No results found.   EKG Interpretation   Date/Time:  Tuesday June 30 2013 10:56:51 EDT Ventricular Rate:  84 PR Interval:  156 QRS Duration: 87 QT Interval:  457 QTC Calculation: 540 R Axis:   45 Text Interpretation:  Sinus rhythm Borderline T abnormalities, lateral  leads, new Minimal ST elevation, anterior leads, similar to prior  Prolonged QT interval Confirmed by Mingo Amber  MD, Falicia Lizotte ([id number]) on 06/30/2013  11:00:56 AM  MDM   Final diagnoses:  Dizziness    39F here with dizziness. Began when standing up today, described as spinning. Lasted about 10 minutes, abated after she sat down and rested. No CP or SOB. No N/V. One episode of diarrhea prior to this. Vitals stable here, feeling well, alert and oriented, asking to go home. She agreed to lab testing. Belly benign, neurologically intact.  Labs stable. Hemoccult positive with brown stool. No melena or frank blood. Patient has had some local irritation due to constipation/miralax use and is currently on creams for her anal irritation. Patient well appearing, normal orthostatics, patient requesting to eat and go home. I had a long discussion about risks vs benefits for patient for admission vs careful, watchful return home with close f/u. Patient would like to go home and follow up with Dr. Felipa Eth in 2 days. Hospital admission for 89 year olds can be risky, and patient appears well and has stable vitals and labs. I think hemoccult positivity likely due to her local anal irritation. Patient given strict return precautions. I spoke with patient, daughter - and everyone is comfortable with this plan. Stable for discharge.    Osvaldo Shipper, MD 06/30/13 1339

## 2013-06-30 NOTE — ED Notes (Signed)
Bed: CL27 Expected date:  Expected time:  Means of arrival:  Comments: EMS-dizzy

## 2014-04-09 ENCOUNTER — Other Ambulatory Visit (HOSPITAL_COMMUNITY): Payer: Self-pay | Admitting: Orthopaedic Surgery

## 2014-04-09 DIAGNOSIS — M545 Low back pain, unspecified: Secondary | ICD-10-CM

## 2014-04-09 DIAGNOSIS — G8929 Other chronic pain: Secondary | ICD-10-CM

## 2014-04-16 ENCOUNTER — Ambulatory Visit (HOSPITAL_COMMUNITY)
Admission: RE | Admit: 2014-04-16 | Discharge: 2014-04-16 | Disposition: A | Discharge status: Home / Self Care | Payer: Medicare Other | Source: Ambulatory Visit | Attending: Orthopaedic Surgery | Admitting: Orthopaedic Surgery

## 2014-04-16 DIAGNOSIS — G8929 Other chronic pain: Secondary | ICD-10-CM

## 2014-04-16 DIAGNOSIS — M545 Low back pain: Secondary | ICD-10-CM | POA: Diagnosis present

## 2014-04-27 ENCOUNTER — Other Ambulatory Visit (HOSPITAL_COMMUNITY): Payer: Medicare Other

## 2014-04-27 ENCOUNTER — Ambulatory Visit (HOSPITAL_COMMUNITY): Payer: Medicare Other

## 2014-07-18 ENCOUNTER — Emergency Department (HOSPITAL_COMMUNITY): Payer: Medicare Other

## 2014-07-18 ENCOUNTER — Emergency Department (HOSPITAL_COMMUNITY)
Admission: EM | Admit: 2014-07-18 | Discharge: 2014-07-18 | Disposition: A | Discharge status: Home / Self Care | Payer: Medicare Other | Attending: Emergency Medicine | Admitting: Emergency Medicine

## 2014-07-18 ENCOUNTER — Encounter (HOSPITAL_COMMUNITY): Payer: Self-pay | Admitting: Emergency Medicine

## 2014-07-18 DIAGNOSIS — Y998 Other external cause status: Secondary | ICD-10-CM | POA: Insufficient documentation

## 2014-07-18 DIAGNOSIS — Z85820 Personal history of malignant melanoma of skin: Secondary | ICD-10-CM | POA: Insufficient documentation

## 2014-07-18 DIAGNOSIS — Z79899 Other long term (current) drug therapy: Secondary | ICD-10-CM | POA: Diagnosis not present

## 2014-07-18 DIAGNOSIS — F329 Major depressive disorder, single episode, unspecified: Secondary | ICD-10-CM | POA: Diagnosis not present

## 2014-07-18 DIAGNOSIS — S22089A Unspecified fracture of T11-T12 vertebra, initial encounter for closed fracture: Secondary | ICD-10-CM | POA: Diagnosis not present

## 2014-07-18 DIAGNOSIS — Z8639 Personal history of other endocrine, nutritional and metabolic disease: Secondary | ICD-10-CM | POA: Diagnosis not present

## 2014-07-18 DIAGNOSIS — W01198A Fall on same level from slipping, tripping and stumbling with subsequent striking against other object, initial encounter: Secondary | ICD-10-CM | POA: Insufficient documentation

## 2014-07-18 DIAGNOSIS — K5900 Constipation, unspecified: Secondary | ICD-10-CM | POA: Diagnosis not present

## 2014-07-18 DIAGNOSIS — F419 Anxiety disorder, unspecified: Secondary | ICD-10-CM | POA: Insufficient documentation

## 2014-07-18 DIAGNOSIS — R011 Cardiac murmur, unspecified: Secondary | ICD-10-CM | POA: Insufficient documentation

## 2014-07-18 DIAGNOSIS — S22080A Wedge compression fracture of T11-T12 vertebra, initial encounter for closed fracture: Secondary | ICD-10-CM

## 2014-07-18 DIAGNOSIS — Y9389 Activity, other specified: Secondary | ICD-10-CM | POA: Diagnosis not present

## 2014-07-18 DIAGNOSIS — Z8719 Personal history of other diseases of the digestive system: Secondary | ICD-10-CM | POA: Diagnosis not present

## 2014-07-18 DIAGNOSIS — M1990 Unspecified osteoarthritis, unspecified site: Secondary | ICD-10-CM | POA: Diagnosis not present

## 2014-07-18 DIAGNOSIS — H3530 Unspecified macular degeneration: Secondary | ICD-10-CM | POA: Diagnosis not present

## 2014-07-18 DIAGNOSIS — I10 Essential (primary) hypertension: Secondary | ICD-10-CM | POA: Diagnosis not present

## 2014-07-18 DIAGNOSIS — Y92002 Bathroom of unspecified non-institutional (private) residence single-family (private) house as the place of occurrence of the external cause: Secondary | ICD-10-CM | POA: Diagnosis not present

## 2014-07-18 DIAGNOSIS — Z8781 Personal history of (healed) traumatic fracture: Secondary | ICD-10-CM | POA: Diagnosis not present

## 2014-07-18 DIAGNOSIS — Z87891 Personal history of nicotine dependence: Secondary | ICD-10-CM | POA: Diagnosis not present

## 2014-07-18 DIAGNOSIS — G8929 Other chronic pain: Secondary | ICD-10-CM | POA: Diagnosis not present

## 2014-07-18 LAB — CBC WITH DIFFERENTIAL/PLATELET
BASOS PCT: 0 % (ref 0–1)
Basophils Absolute: 0 10*3/uL (ref 0.0–0.1)
EOS PCT: 0 % (ref 0–5)
Eosinophils Absolute: 0 10*3/uL (ref 0.0–0.7)
HCT: 37.6 % (ref 36.0–46.0)
HEMOGLOBIN: 12.6 g/dL (ref 12.0–15.0)
Lymphocytes Relative: 15 % (ref 12–46)
Lymphs Abs: 2.2 10*3/uL (ref 0.7–4.0)
MCH: 30.8 pg (ref 26.0–34.0)
MCHC: 33.5 g/dL (ref 30.0–36.0)
MCV: 91.9 fL (ref 78.0–100.0)
MONO ABS: 1.3 10*3/uL — AB (ref 0.1–1.0)
MONOS PCT: 8 % (ref 3–12)
NEUTROS ABS: 11.8 10*3/uL — AB (ref 1.7–7.7)
NEUTROS PCT: 77 % (ref 43–77)
Platelets: 183 10*3/uL (ref 150–400)
RBC: 4.09 MIL/uL (ref 3.87–5.11)
RDW: 13.2 % (ref 11.5–15.5)
WBC: 15.3 10*3/uL — ABNORMAL HIGH (ref 4.0–10.5)

## 2014-07-18 LAB — COMPREHENSIVE METABOLIC PANEL
ALK PHOS: 70 U/L (ref 38–126)
ALT: 64 U/L — AB (ref 14–54)
AST: 86 U/L — ABNORMAL HIGH (ref 15–41)
Albumin: 3.9 g/dL (ref 3.5–5.0)
Anion gap: 8 (ref 5–15)
BILIRUBIN TOTAL: 2 mg/dL — AB (ref 0.3–1.2)
BUN: 15 mg/dL (ref 6–20)
CALCIUM: 8.6 mg/dL — AB (ref 8.9–10.3)
CO2: 28 mmol/L (ref 22–32)
Chloride: 96 mmol/L — ABNORMAL LOW (ref 101–111)
Creatinine, Ser: 0.49 mg/dL (ref 0.44–1.00)
GFR calc Af Amer: >60 mL/min (ref >60)
GFR calc non Af Amer: >60 mL/min (ref >60)
Glucose, Bld: 137 mg/dL — ABNORMAL HIGH (ref 65–99)
POTASSIUM: 4.7 mmol/L (ref 3.5–5.1)
Sodium: 132 mmol/L — ABNORMAL LOW (ref 135–145)
TOTAL PROTEIN: 7.1 g/dL (ref 6.5–8.1)

## 2014-07-18 LAB — URINALYSIS, ROUTINE W REFLEX MICROSCOPIC
Bilirubin Urine: NEGATIVE
Glucose, UA: NEGATIVE mg/dL
HGB URINE DIPSTICK: NEGATIVE
KETONES UR: NEGATIVE mg/dL
NITRITE: NEGATIVE
PH: 8.5 — AB (ref 5.0–8.0)
Protein, ur: 30 mg/dL — AB
SPECIFIC GRAVITY, URINE: 1.016 (ref 1.005–1.030)
Urobilinogen, UA: 0.2 mg/dL (ref 0.0–1.0)

## 2014-07-18 LAB — URINE MICROSCOPIC-ADD ON

## 2014-07-18 LAB — LIPASE, BLOOD: Lipase: 13 U/L — ABNORMAL LOW (ref 22–51)

## 2014-07-18 LAB — I-STAT CG4 LACTIC ACID, ED: Lactic Acid, Venous: 0.84 mmol/L (ref 0.5–2.0)

## 2014-07-18 MED ORDER — SODIUM CHLORIDE 0.9 % IV BOLUS (SEPSIS)
500.0000 mL | Freq: Once | INTRAVENOUS | Status: AC
  Administered 2014-07-18: 500 mL via INTRAVENOUS

## 2014-07-18 MED ORDER — IOHEXOL 300 MG/ML  SOLN
25.0000 mL | Freq: Once | INTRAMUSCULAR | Status: AC | PRN
  Administered 2014-07-18: 50 mL via ORAL

## 2014-07-18 MED ORDER — IOHEXOL 300 MG/ML  SOLN
100.0000 mL | Freq: Once | INTRAMUSCULAR | Status: AC | PRN
  Administered 2014-07-18: 80 mL via INTRAVENOUS

## 2014-07-18 NOTE — ED Notes (Signed)
Per EMS and family, patient has had a fleets enema, three "rounds" of Miralax and 3/4 btl of Mag Citrate with no effect.

## 2014-07-18 NOTE — ED Notes (Signed)
No BM times one week.  Patient states she has bm every three days. Denies abdominal pain. A&O times 4, ambulatory with heavy verbal cues. Fall Thursday, not seen for fall.

## 2014-07-18 NOTE — ED Notes (Signed)
Bed: WA21 Expected date: 07/18/14 Expected time: 2:32 PM Means of arrival: Ambulance Comments: Constipation

## 2014-07-18 NOTE — ED Provider Notes (Addendum)
CSN: [id number]     Arrival date & time 07/18/14  1433 History   First MD Initiated Contact with Patient 07/18/14 1532     Chief Complaint  Patient presents with  . Constipation     (Consider location/radiation/quality/duration/timing/severity/associated sxs/prior Treatment) Patient is a 81 y.o. female presenting with constipation. The history is provided by the patient.  Constipation Severity:  Severe Time since last bowel movement:  1 week Timing:  Constant Progression:  Worsening Chronicity:  New Context: dehydration   Context comment:  Patient had diarrhea earlier last week and started decreasing the amount of MiraLAX she was taking. Now she has not had a bowel movement in over a week. Over last 24 hours she strata fleets enema, 3 rounds of MiraLAX and magnesium citrate Stool description:  None produced Relieved by:  Nothing Worsened by:  Nothing tried Ineffective treatments:  None tried Associated symptoms comment:  Occasional abdominal cramping. She had no nausea and vomiting until drinking magnesium citrate today and then approximately 1 hour later had multiple episodes of vomiting. She currently denies any nausea or abdominal pain. No recent medication changes except for decreasing the quantity of MiraLAX she takes. Also patient noted on Thursday she had a mechanical fall in her bathroom where her back hit the bathtub side and since that time she's had worsening pain in her back. Risk factors comment:  Status post cholecystectomy   Past Medical History  Diagnosis Date  . Depression   . Hemorrhoids, external   . Macular degeneration   . Aortic stenosis     Severe on ECHO 11/03/09 AVA .75 cm2  . Insomnia   . Anxiety   . Hip fracture, left 07/2005    "no surgery; it's completely healed" &/notes 08/02/2005 (05/23/2012)  . Heart murmur     "since I was a child" (05/23/2012)  . Arthritis     "it's beginning" (05/23/2012)  . Chronic lower back pain   . Skin cancer of face    "under left side of nose" (05/23/2012)  . Expressive aphasia     "couple times recently" (05/23/2012)  . Mitral regurgitation   . Carotid stenosis     Right 70%, Left 50%  . Hypercholesterolemia     Goal LDL less tha 70  . Mandibular mass 2001    Left  . Chest discomfort     No further chest discomfort since  06/09/10  . Hypertension   . Allergic rhinitis    Past Surgical History  Procedure Laterality Date  . Cholecystectomy    . Cataract extraction w/ intraocular lens  implant, bilateral    . Submandibular gland excision Left 05/2010    "infected; taken out" (05/23/2012)  . Skin cancer excision  2010    "not long ago; off left side under my nose" (05/23/2012) Basal cell  . Tonsillectomy Left   . Breast biopsy      Benign  . Septoplasty    . Orif hip fracture Left 1997  . Dilation and curettage of uterus     Family History  Problem Relation Age of Onset  . Congestive Heart Failure Mother   . Macular degeneration Mother   . CVA Father   . Heart attack Sister   . Macular degeneration Sister   . Heart disease Brother   . Arthritis Brother    History  Substance Use Topics  . Smoking status: Former Smoker -- 20 years    Types: Cigarettes    Quit date: 01/29/1978  .  Smokeless tobacco: Never Used     Comment: 05/23/2012 'quit smoking in my 20's"  . Alcohol Use: No   OB History    No data available     Review of Systems  Gastrointestinal: Positive for constipation.  All other systems reviewed and are negative.     Allergies  Gabapentin and Other  Home Medications   Prior to Admission medications   Medication Sig Start Date End Date Taking? Authorizing Provider  acetaminophen (TYLENOL) 500 MG tablet Take 1,000 mg by mouth 3 (three) times daily.   Yes Historical Provider, MD  ALPRAZolam Duanne Moron) 1 MG tablet Take 1 mg by mouth 3 (three) times daily.   Yes Historical Provider, MD  Calcium Citrate-Vitamin D (CITRACAL + D PO) Take 1-2 tablets by mouth 2 (two) times  daily. Calcium 600 mg/Vitamin D. 400 i.u. (2 tablets at lunch, 1 tablet at supper)   Yes Historical Provider, MD  FLUoxetine (PROZAC) 20 MG capsule Take 40 mg by mouth daily.    Yes Historical Provider, MD  Multiple Vitamin (MULTIVITAMIN WITH MINERALS) TABS Take 1 tablet by mouth daily. Centrum Silver   Yes Historical Provider, MD  Omega-3 Fatty Acids (FISH OIL) 600 MG CAPS Take 1,200 mg by mouth daily.   Yes Historical Provider, MD  Polyethyl Glycol-Propyl Glycol (SYSTANE OP) Place 1 drop into both eyes as needed (for dry eyes).   Yes Historical Provider, MD  QUEtiapine (SEROQUEL) 50 MG tablet Take 50 mg by mouth at bedtime.   Yes Historical Provider, MD  traMADol (ULTRAM) 50 MG tablet Take 50 mg by mouth 3 (three) times daily.   Yes Historical Provider, MD   BP 165/73 mmHg  Pulse 94  Resp 18  Ht 5\' 2"  (1.575 m)  Wt 136 lb (61.689 kg)  BMI 24.87 kg/m2  SpO2 97% Physical Exam  Constitutional: She is oriented to person, place, and time. She appears well-developed and well-nourished. No distress.  HENT:  Head: Normocephalic and atraumatic.  Mouth/Throat: Oropharynx is clear and moist. Mucous membranes are dry.  Eyes: Conjunctivae and EOM are normal. Pupils are equal, round, and reactive to light.  Neck: Normal range of motion. Neck supple.  Cardiovascular: Normal rate, regular rhythm and intact distal pulses.   Murmur heard.  Systolic murmur is present with a grade of 3/6  Heard best at LSB radiating to the carotids  Pulmonary/Chest: Effort normal and breath sounds normal. No respiratory distress. She has no wheezes. She has no rales.  Abdominal: Soft. She exhibits no distension. Bowel sounds are decreased. There is no tenderness. There is no rebound and no guarding.  Genitourinary: Rectal exam shows no external hemorrhoid and no tenderness.  No stool in the rectal vault  Musculoskeletal: Normal range of motion. She exhibits tenderness. She exhibits no edema.  Tenderness in the  musculature of the lumbar spine without midline lumbar tenderness  Neurological: She is alert and oriented to person, place, and time.  Skin: Skin is warm and dry. No rash noted. No erythema.  Psychiatric: She has a normal mood and affect. Her behavior is normal.  Nursing note and vitals reviewed.   ED Course  Procedures (including critical care time) Labs Review Labs Reviewed  CBC WITH DIFFERENTIAL/PLATELET - Abnormal; Notable for the following:    WBC 15.3 (*)    Neutro Abs 11.8 (*)    Monocytes Absolute 1.3 (*)    All other components within normal limits  COMPREHENSIVE METABOLIC PANEL - Abnormal; Notable for the following:  Sodium 132 (*)    Chloride 96 (*)    Glucose, Bld 137 (*)    Calcium 8.6 (*)    AST 86 (*)    ALT 64 (*)    Total Bilirubin 2.0 (*)    All other components within normal limits  LIPASE, BLOOD - Abnormal; Notable for the following:    Lipase 13 (*)    All other components within normal limits  URINALYSIS, ROUTINE W REFLEX MICROSCOPIC (NOT AT [facility]) - Abnormal; Notable for the following:    APPearance TURBID (*)    pH 8.5 (*)    Protein, ur 30 (*)    Leukocytes, UA TRACE (*)    All other components within normal limits  URINE MICROSCOPIC-ADD ON  I-STAT CG4 LACTIC ACID, ED    Imaging Review Ct Abdomen Pelvis W Contrast  07/18/2014   CLINICAL DATA:  Unable to have bowel movement.  EXAM: CT ABDOMEN AND PELVIS WITH CONTRAST  TECHNIQUE: Multidetector CT imaging of the abdomen and pelvis was performed using the standard protocol following bolus administration of intravenous contrast.  CONTRAST:  43mL OMNIPAQUE IOHEXOL 300 MG/ML SOLN, 56mL OMNIPAQUE IOHEXOL 300 MG/ML SOLN  COMPARISON:  None.  FINDINGS: BODY WALL: No contributory findings.  LOWER CHEST: Extensive aortic valve calcifications/ sclerosis.  Prominent left atrial size.  Diffuse coronary atherosclerosis.  Subpleural reticulation at the bases.  Appearance favoring scarring.  ABDOMEN/PELVIS:  Liver: No  focal abnormality.  Biliary: Intra greater than extrahepatic biliary duct enlargement post cholecystectomy. No calcified choledocholithiasis. The common bile duct measures 7 mm in diameter.  Pancreas: Generalized atrophy without ductal enlargement  Spleen: Unremarkable.  Adrenals: Unremarkable.  Kidneys and ureters: 1 cm cyst from the lower pole left kidney. Fatty focus in the interpolar left kidney, excavation reverses angiomyolipoma, only 6 mm in diameter.  Bladder: Unremarkable.  Reproductive: Abnormal thickening of the endometrial cavity to 14 mm with mild heterogeneity. Fairly symmetric ovarian volume.  Bowel: The colon is distended and contains fluid or stool diffusely. Stool is desiccated distally, without rectal distention suggestive of impaction. Redundant sigmoid without volvulus or other obstructive process. No inflammatory bowel wall thickening. No appendicitis.  Retroperitoneum: No mass or adenopathy.  Peritoneum: No ascites or pneumoperitoneum.  Vascular: Extensive aortic and branch vessel atherosclerosis.  OSSEOUS: Diffuse degenerative disc disease, recently evaluated by MRI. There is a new T12 compression fracture with 25-50% height loss. Trace retropulsion. The fracture is unhealed, with fracture line anteriorly.  Remote left femoral neck fracture status post ORIF.  Remote left inferior pubic ramus fracture.  IMPRESSION: 1. Diffuse distension of colon by fluid and stool, correlating with the history. No causative obstruction or inflammatory process. 2. Acute/unhealed T12 compression fracture with moderate height loss. 3. Biliary dilatation which could be related to remote cholecystectomy, stricture, or noncalcified choledocholithiasis. Correlate with liver function tests. 4. Extensive aortic valve calcifications/sclerosis. 5. Abnormal endometrial thickening (14 mm). Recommend gynecologic referral for sonography.   Electronically Signed   By: Monte Fantasia M.D.   On: 07/18/2014 18:53   Dg Abd  Acute W/chest  07/18/2014   CLINICAL DATA:  Vomiting, bilateral flank pain  EXAM: DG ABDOMEN ACUTE W/ 1V CHEST  COMPARISON:  None.  FINDINGS: There is no evidence of dilated bowel loops or free intraperitoneal air. No radiopaque calculi or other significant radiographic abnormality is seen. Heart size and mediastinal contours are within normal limits. Both lungs are clear. The S shaped thoracolumbar scoliosis and lower lumbar spine degenerative change noted. Left femoral nails in  place. Cholecystectomy clips are noted.  IMPRESSION: Negative abdominal radiographs.  No acute cardiopulmonary disease.   Electronically Signed   By: Conchita Paris M.D.   On: 07/18/2014 16:49     EKG Interpretation None      MDM   Final diagnoses:  Constipation  T12 compression fracture, initial encounter   Patient presenting with a one-week history of unable to use the bathroom. Her last bowel movement was approximately 1 week ago. Patient had diarrhea last week and since that time had been taking MiraLAX every other day. She is now unable to have a bowel movement. She denies persistent abdominal pain, nausea or vomiting. Until today she was eating a normal diet. No urinary symptoms. No cardiac or respiratory complaints. Today patient has tried 3 doses of MiraLAX, magnesium citrate and fleets enema without bowel movement. After drinking the magnesium citrate she had several episodes of vomiting. Currently her only complaint is inability to produce stool. She has no reproducible abdominal pain on exam and no stool in the rectal vault. Possibility that patient is just constipated however concern for possible diverticulitis versus appendicitis versus perforation versus obstruction.  CBC, CMP, lipase, UA, lactic acid, acute abdominal series pending. Patient will also be given IV fluids as she has had decreased intake today.  4:42 PM CBC with leukocytosis of 15,000.  CMP, lactate all without acute findings.  AAS without  acute findings. CT pending.  7:28 PM CT showing a new T12 compression fracture but no other acute findings. On returning to the room patient is now having a large amount of stool from the medication she had taken earlier. Discussed with Dr. Trenton Gammon from neurosurgery and feel that the brace would be more harm than good. Discussed findings with patient and her family and they're comfortable continuing tramadol at home. She has a home health aide 24 hours who will help her to the bathroom and ensure her safety. Patient was discharged home.  Blanchie Dessert, MD 07/18/14 1929  Blanchie Dessert, MD 07/18/14 [id number]

## 2014-07-18 NOTE — Discharge Instructions (Signed)
Back, Compression Fracture °A compression fracture happens when a force is put upon the length of your spine. Slipping and falling on your bottom are examples of such a force. When this happens, sometimes the force is great enough to compress the building blocks (vertebral bodies) of your spine. Although this causes a lot of pain, this can usually be treated at home, unless your caregiver feels hospitalization is needed for pain control. °Your backbone (spinal column) is made up of 24 main vertebral bodies in addition to the sacrum and coccyx (see illustration). These are held together by tough fibrous tissues (ligaments) and by support of your muscles. Nerve roots pass through the openings between the vertebrae. A sudden wrenching move, injury, or a fall may cause a compression fracture of one of the vertebral bodies. This may result in back pain or spread of pain into the belly (abdomen), the buttocks, and down the leg into the foot. Pain may also be created by muscle spasm alone. °Large studies have been undertaken to determine the best possible course of action to help your back following injury and also to prevent future problems. The recommendations are as follows. °FOLLOWING A COMPRESSION FRACTURE: °Do the following only if advised by your caregiver.  °· If a back brace has been suggested or provided, wear it as directed. °· Do not stop wearing the back brace unless instructed by your caregiver. °· When allowed to return to regular activities, avoid a sedentary lifestyle. Actively exercise. Sporadic weekend binges of tennis, racquetball, or waterskiing may actually aggravate or create problems, especially if you are not in condition for that activity. °· Avoid sports requiring sudden body movements until you are in condition for them. Swimming and walking are safer activities. °· Maintain good posture. °· Avoid obesity. °· If not already done, you should have a DEXA scan. Based on the results, be treated for  osteoporosis. °FOLLOWING ACUTE (SUDDEN) INJURY: °· Only take over-the-counter or prescription medicines for pain, discomfort, or fever as directed by your caregiver. °· Use bed rest for only the most extreme acute episode. Prolonged bed rest may aggravate your condition. Ice used for acute conditions is effective. Use a large plastic bag filled with ice. Wrap it in a towel. This also provides excellent pain relief. This may be continuous. Or use it for 30 minutes every 2 hours during acute phase, then as needed. Heat for 30 minutes prior to activities is helpful. °· As soon as the acute phase (the time when your back is too painful for you to do normal activities) is over, it is important to resume normal activities and work hardening programs. Back injuries can cause potentially marked changes in lifestyle. So it is important to attack these problems aggressively. °· See your caregiver for continued problems. He or she can help or refer you for appropriate exercises, physical therapy, and work hardening if needed. °· If you are given narcotic medications for your condition, for the next 24 hours do not: °¨ Drive. °¨ Operate machinery or power tools. °¨ Sign legal documents. °· Do not drink alcohol, or take sleeping pills or other medications that may interfere with treatment. °If your caregiver has given you a follow-up appointment, it is very important to keep that appointment. Not keeping the appointment could result in a chronic or permanent injury, pain, and disability. If there is any problem keeping the appointment, you must call back to this facility for assistance.  °SEEK IMMEDIATE MEDICAL CARE IF: °· You develop numbness,   tingling, weakness, or problems with the use of your arms or legs. °· You develop severe back pain not relieved with medications. °· You have changes in bowel or bladder control. °· You have increasing pain in any areas of the body. °Document Released: 01/15/2005 Document Revised:  06/01/2013 Document Reviewed: 08/20/2007 °[employer]® Patient Information ©2015 [employer]. This information is not intended to replace advice given to you by your health care provider. Make sure you discuss any questions you have with your health care provider. ° °

## 2014-07-18 NOTE — ED Notes (Signed)
Assisted patient to bedside commode. Approx 200 mL of yellow urine.  Changed incontinence brief and assisted patient back to bed. Call bell in reach.

## 2014-07-18 NOTE — ED Notes (Signed)
Tramadol 50 mg and 1 mg alprazolam by mouth by family from patient's own meds. EDP okay with administration.

## 2014-08-03 ENCOUNTER — Other Ambulatory Visit: Payer: Self-pay | Admitting: Geriatric Medicine

## 2014-08-03 DIAGNOSIS — S22000A Wedge compression fracture of unspecified thoracic vertebra, initial encounter for closed fracture: Secondary | ICD-10-CM

## 2014-08-12 ENCOUNTER — Other Ambulatory Visit: Payer: Self-pay | Admitting: Geriatric Medicine

## 2014-08-12 DIAGNOSIS — S22000A Wedge compression fracture of unspecified thoracic vertebra, initial encounter for closed fracture: Secondary | ICD-10-CM

## 2014-08-21 ENCOUNTER — Other Ambulatory Visit: Payer: Medicare Other

## 2014-10-05 ENCOUNTER — Encounter (HOSPITAL_COMMUNITY): Payer: Self-pay | Admitting: Emergency Medicine

## 2014-10-05 ENCOUNTER — Inpatient Hospital Stay (HOSPITAL_COMMUNITY)
Admission: EM | Admit: 2014-10-05 | Discharge: 2014-10-30 | DRG: 177 | Disposition: E | Payer: Medicare Other | Attending: Internal Medicine | Admitting: Internal Medicine

## 2014-10-05 ENCOUNTER — Emergency Department (HOSPITAL_COMMUNITY): Payer: Medicare Other

## 2014-10-05 DIAGNOSIS — E785 Hyperlipidemia, unspecified: Secondary | ICD-10-CM | POA: Diagnosis present

## 2014-10-05 DIAGNOSIS — Z85828 Personal history of other malignant neoplasm of skin: Secondary | ICD-10-CM | POA: Diagnosis not present

## 2014-10-05 DIAGNOSIS — Z823 Family history of stroke: Secondary | ICD-10-CM

## 2014-10-05 DIAGNOSIS — Z9049 Acquired absence of other specified parts of digestive tract: Secondary | ICD-10-CM | POA: Diagnosis present

## 2014-10-05 DIAGNOSIS — I4891 Unspecified atrial fibrillation: Secondary | ICD-10-CM | POA: Diagnosis present

## 2014-10-05 DIAGNOSIS — Z8249 Family history of ischemic heart disease and other diseases of the circulatory system: Secondary | ICD-10-CM | POA: Diagnosis not present

## 2014-10-05 DIAGNOSIS — J690 Pneumonitis due to inhalation of food and vomit: Principal | ICD-10-CM | POA: Diagnosis present

## 2014-10-05 DIAGNOSIS — K5900 Constipation, unspecified: Secondary | ICD-10-CM | POA: Diagnosis present

## 2014-10-05 DIAGNOSIS — Z9842 Cataract extraction status, left eye: Secondary | ICD-10-CM

## 2014-10-05 DIAGNOSIS — T17998A Other foreign object in respiratory tract, part unspecified causing other injury, initial encounter: Secondary | ICD-10-CM

## 2014-10-05 DIAGNOSIS — I5033 Acute on chronic diastolic (congestive) heart failure: Secondary | ICD-10-CM | POA: Diagnosis present

## 2014-10-05 DIAGNOSIS — F411 Generalized anxiety disorder: Secondary | ICD-10-CM | POA: Diagnosis present

## 2014-10-05 DIAGNOSIS — F329 Major depressive disorder, single episode, unspecified: Secondary | ICD-10-CM | POA: Diagnosis present

## 2014-10-05 DIAGNOSIS — Z79899 Other long term (current) drug therapy: Secondary | ICD-10-CM

## 2014-10-05 DIAGNOSIS — Z9889 Other specified postprocedural states: Secondary | ICD-10-CM | POA: Diagnosis not present

## 2014-10-05 DIAGNOSIS — Z87891 Personal history of nicotine dependence: Secondary | ICD-10-CM

## 2014-10-05 DIAGNOSIS — B370 Candidal stomatitis: Secondary | ICD-10-CM | POA: Diagnosis not present

## 2014-10-05 DIAGNOSIS — G8929 Other chronic pain: Secondary | ICD-10-CM | POA: Diagnosis present

## 2014-10-05 DIAGNOSIS — R0600 Dyspnea, unspecified: Secondary | ICD-10-CM | POA: Diagnosis present

## 2014-10-05 DIAGNOSIS — J189 Pneumonia, unspecified organism: Secondary | ICD-10-CM | POA: Diagnosis present

## 2014-10-05 DIAGNOSIS — H3530 Unspecified macular degeneration: Secondary | ICD-10-CM | POA: Diagnosis present

## 2014-10-05 DIAGNOSIS — B379 Candidiasis, unspecified: Secondary | ICD-10-CM | POA: Diagnosis present

## 2014-10-05 DIAGNOSIS — I10 Essential (primary) hypertension: Secondary | ICD-10-CM | POA: Diagnosis present

## 2014-10-05 DIAGNOSIS — J9601 Acute respiratory failure with hypoxia: Secondary | ICD-10-CM | POA: Diagnosis present

## 2014-10-05 DIAGNOSIS — Z961 Presence of intraocular lens: Secondary | ICD-10-CM | POA: Diagnosis present

## 2014-10-05 DIAGNOSIS — I350 Nonrheumatic aortic (valve) stenosis: Secondary | ICD-10-CM | POA: Diagnosis present

## 2014-10-05 DIAGNOSIS — Z9841 Cataract extraction status, right eye: Secondary | ICD-10-CM | POA: Diagnosis not present

## 2014-10-05 DIAGNOSIS — I340 Nonrheumatic mitral (valve) insufficiency: Secondary | ICD-10-CM | POA: Diagnosis present

## 2014-10-05 DIAGNOSIS — E780 Pure hypercholesterolemia: Secondary | ICD-10-CM | POA: Diagnosis present

## 2014-10-05 DIAGNOSIS — M545 Low back pain: Secondary | ICD-10-CM | POA: Diagnosis present

## 2014-10-05 DIAGNOSIS — R7989 Other specified abnormal findings of blood chemistry: Secondary | ICD-10-CM | POA: Diagnosis present

## 2014-10-05 DIAGNOSIS — J9600 Acute respiratory failure, unspecified whether with hypoxia or hypercapnia: Secondary | ICD-10-CM

## 2014-10-05 DIAGNOSIS — I480 Paroxysmal atrial fibrillation: Secondary | ICD-10-CM | POA: Diagnosis present

## 2014-10-05 DIAGNOSIS — Z515 Encounter for palliative care: Secondary | ICD-10-CM

## 2014-10-05 DIAGNOSIS — J9690 Respiratory failure, unspecified, unspecified whether with hypoxia or hypercapnia: Secondary | ICD-10-CM | POA: Diagnosis present

## 2014-10-05 DIAGNOSIS — R778 Other specified abnormalities of plasma proteins: Secondary | ICD-10-CM | POA: Diagnosis present

## 2014-10-05 DIAGNOSIS — I248 Other forms of acute ischemic heart disease: Secondary | ICD-10-CM | POA: Diagnosis not present

## 2014-10-05 DIAGNOSIS — I6523 Occlusion and stenosis of bilateral carotid arteries: Secondary | ICD-10-CM | POA: Diagnosis present

## 2014-10-05 DIAGNOSIS — T17908A Unspecified foreign body in respiratory tract, part unspecified causing other injury, initial encounter: Secondary | ICD-10-CM | POA: Diagnosis present

## 2014-10-05 DIAGNOSIS — Z66 Do not resuscitate: Secondary | ICD-10-CM | POA: Diagnosis present

## 2014-10-05 DIAGNOSIS — M1990 Unspecified osteoarthritis, unspecified site: Secondary | ICD-10-CM | POA: Diagnosis present

## 2014-10-05 DIAGNOSIS — R1314 Dysphagia, pharyngoesophageal phase: Secondary | ICD-10-CM | POA: Diagnosis present

## 2014-10-05 DIAGNOSIS — Z789 Other specified health status: Secondary | ICD-10-CM | POA: Diagnosis not present

## 2014-10-05 LAB — CBC WITH DIFFERENTIAL/PLATELET
BASOS ABS: 0 10*3/uL (ref 0.0–0.1)
BASOS PCT: 0 % (ref 0–1)
EOS ABS: 0 10*3/uL (ref 0.0–0.7)
EOS PCT: 0 % (ref 0–5)
HCT: 35.3 % — ABNORMAL LOW (ref 36.0–46.0)
Hemoglobin: 11.4 g/dL — ABNORMAL LOW (ref 12.0–15.0)
Lymphocytes Relative: 9 % — ABNORMAL LOW (ref 12–46)
Lymphs Abs: 1.7 10*3/uL (ref 0.7–4.0)
MCH: 30.2 pg (ref 26.0–34.0)
MCHC: 32.3 g/dL (ref 30.0–36.0)
MCV: 93.4 fL (ref 78.0–100.0)
MONO ABS: 1.4 10*3/uL — AB (ref 0.1–1.0)
Monocytes Relative: 8 % (ref 3–12)
Neutro Abs: 15.2 10*3/uL — ABNORMAL HIGH (ref 1.7–7.7)
Neutrophils Relative %: 83 % — ABNORMAL HIGH (ref 43–77)
PLATELETS: 243 10*3/uL (ref 150–400)
RBC: 3.78 MIL/uL — ABNORMAL LOW (ref 3.87–5.11)
RDW: 15 % (ref 11.5–15.5)
WBC: 18.3 10*3/uL — ABNORMAL HIGH (ref 4.0–10.5)

## 2014-10-05 LAB — COMPREHENSIVE METABOLIC PANEL
ALBUMIN: 3.7 g/dL (ref 3.5–5.0)
ALT: 24 U/L (ref 14–54)
AST: 34 U/L (ref 15–41)
Alkaline Phosphatase: 65 U/L (ref 38–126)
Anion gap: 9 (ref 5–15)
BUN: 18 mg/dL (ref 6–20)
CHLORIDE: 98 mmol/L — AB (ref 101–111)
CO2: 27 mmol/L (ref 22–32)
Calcium: 9 mg/dL (ref 8.9–10.3)
Creatinine, Ser: 0.75 mg/dL (ref 0.44–1.00)
GFR calc Af Amer: >60 mL/min (ref >60)
Glucose, Bld: 190 mg/dL — ABNORMAL HIGH (ref 65–99)
POTASSIUM: 4 mmol/L (ref 3.5–5.1)
SODIUM: 134 mmol/L — AB (ref 135–145)
Total Bilirubin: 1.3 mg/dL — ABNORMAL HIGH (ref 0.3–1.2)
Total Protein: 6.4 g/dL — ABNORMAL LOW (ref 6.5–8.1)

## 2014-10-05 LAB — I-STAT ARTERIAL BLOOD GAS, ED
Bicarbonate: 25.8 mEq/L — ABNORMAL HIGH (ref 20.0–24.0)
O2 SAT: 98 %
PCO2 ART: 45.3 mmHg — AB (ref 35.0–45.0)
PO2 ART: 100 mmHg (ref 80.0–100.0)
Patient temperature: 97.7
TCO2: 27 mmol/L (ref 0–100)
pH, Arterial: 7.361 (ref 7.350–7.450)

## 2014-10-05 LAB — TROPONIN I
TROPONIN I: 0.08 ng/mL — AB (ref <0.031)
Troponin I: 0.25 ng/mL — ABNORMAL HIGH (ref <0.031)

## 2014-10-05 LAB — LACTIC ACID, PLASMA
Lactic Acid, Venous: 3.8 mmol/L (ref 0.5–2.0)
Lactic Acid, Venous: 2.3 mmol/L (ref 0.5–2.0)

## 2014-10-05 LAB — MRSA PCR SCREENING: MRSA by PCR: NEGATIVE

## 2014-10-05 LAB — PROCALCITONIN: Procalcitonin: 0.19 ng/mL

## 2014-10-05 MED ORDER — MAGNESIUM SULFATE 2 GM/50ML IV SOLN
2.0000 g | Freq: Once | INTRAVENOUS | Status: AC
  Administered 2014-10-05: 2 g via INTRAVENOUS
  Filled 2014-10-05: qty 50

## 2014-10-05 MED ORDER — SODIUM CHLORIDE 0.9 % IV BOLUS (SEPSIS)
500.0000 mL | Freq: Once | INTRAVENOUS | Status: AC
  Administered 2014-10-05: 500 mL via INTRAVENOUS

## 2014-10-05 MED ORDER — ALBUTEROL SULFATE (2.5 MG/3ML) 0.083% IN NEBU
2.5000 mg | INHALATION_SOLUTION | Freq: Four times a day (QID) | RESPIRATORY_TRACT | Status: DC
  Administered 2014-10-05 – 2014-10-07 (×6): 2.5 mg via RESPIRATORY_TRACT
  Filled 2014-10-05 (×7): qty 3

## 2014-10-05 MED ORDER — ASPIRIN 300 MG RE SUPP: 300.0000 mg | RECTAL | Status: AC

## 2014-10-05 MED ORDER — ALBUTEROL SULFATE (2.5 MG/3ML) 0.083% IN NEBU
5.0000 mg | INHALATION_SOLUTION | Freq: Once | RESPIRATORY_TRACT | Status: AC
  Administered 2014-10-05: 5 mg via RESPIRATORY_TRACT
  Filled 2014-10-05: qty 6

## 2014-10-05 MED ORDER — METRONIDAZOLE IN NACL 5-0.79 MG/ML-% IV SOLN
500.0000 mg | Freq: Once | INTRAVENOUS | Status: AC
  Administered 2014-10-05: 500 mg via INTRAVENOUS
  Filled 2014-10-05: qty 100

## 2014-10-05 MED ORDER — DEXTROSE 5 % IV SOLN
500.0000 mg | Freq: Once | INTRAVENOUS | Status: AC
  Administered 2014-10-05: 500 mg via INTRAVENOUS
  Filled 2014-10-05: qty 500

## 2014-10-05 MED ORDER — DM-GUAIFENESIN ER 30-600 MG PO TB12
1.0000 | ORAL_TABLET | Freq: Two times a day (BID) | ORAL | Status: DC
  Filled 2014-10-05: qty 1

## 2014-10-05 MED ORDER — SODIUM CHLORIDE 0.9 % IV SOLN
250.0000 mL | INTRAVENOUS | Status: DC | PRN
  Administered 2014-10-05: 20 mL via INTRAVENOUS

## 2014-10-05 MED ORDER — ENOXAPARIN SODIUM 40 MG/0.4ML ~~LOC~~ SOLN
40.0000 mg | SUBCUTANEOUS | Status: DC
  Administered 2014-10-05 – 2014-10-06 (×2): 40 mg via SUBCUTANEOUS
  Filled 2014-10-05 (×2): qty 0.4

## 2014-10-05 MED ORDER — METHYLPREDNISOLONE SODIUM SUCC 125 MG IJ SOLR
125.0000 mg | Freq: Once | INTRAMUSCULAR | Status: AC
  Administered 2014-10-05: 125 mg via INTRAVENOUS
  Filled 2014-10-05: qty 2

## 2014-10-05 MED ORDER — QUETIAPINE FUMARATE 50 MG PO TABS
50.0000 mg | ORAL_TABLET | Freq: Every day | ORAL | Status: DC
  Administered 2014-10-05: 50 mg via ORAL
  Filled 2014-10-05: qty 1

## 2014-10-05 MED ORDER — TRAMADOL HCL 50 MG PO TABS
50.0000 mg | ORAL_TABLET | Freq: Three times a day (TID) | ORAL | Status: DC
  Administered 2014-10-05 – 2014-10-06 (×3): 50 mg via ORAL
  Filled 2014-10-05 (×4): qty 1

## 2014-10-05 MED ORDER — METHYLPREDNISOLONE SODIUM SUCC 125 MG IJ SOLR
60.0000 mg | Freq: Two times a day (BID) | INTRAMUSCULAR | Status: DC
  Administered 2014-10-05 – 2014-10-06 (×2): 60 mg via INTRAVENOUS
  Filled 2014-10-05 (×2): qty 2

## 2014-10-05 MED ORDER — PANTOPRAZOLE SODIUM 40 MG IV SOLR
40.0000 mg | Freq: Every day | INTRAVENOUS | Status: DC
  Administered 2014-10-05 – 2014-10-06 (×2): 40 mg via INTRAVENOUS
  Filled 2014-10-05 (×2): qty 40

## 2014-10-05 MED ORDER — ONDANSETRON HCL 4 MG/2ML IJ SOLN: 4.0000 mg | Freq: Four times a day (QID) | INTRAMUSCULAR | Status: DC | PRN

## 2014-10-05 MED ORDER — CEFTRIAXONE SODIUM 1 G IJ SOLR
1.0000 g | Freq: Once | INTRAMUSCULAR | Status: AC
  Administered 2014-10-05: 1 g via INTRAVENOUS
  Filled 2014-10-05: qty 10

## 2014-10-05 MED ORDER — ACETAMINOPHEN 500 MG PO TABS
1000.0000 mg | ORAL_TABLET | Freq: Three times a day (TID) | ORAL | Status: DC
  Administered 2014-10-05 (×2): 1000 mg via ORAL
  Filled 2014-10-05 (×2): qty 2

## 2014-10-05 MED ORDER — IPRATROPIUM BROMIDE 0.02 % IN SOLN
0.5000 mg | Freq: Once | RESPIRATORY_TRACT | Status: AC
  Administered 2014-10-05: 0.5 mg via RESPIRATORY_TRACT
  Filled 2014-10-05: qty 2.5

## 2014-10-05 MED ORDER — ALPRAZOLAM 0.5 MG PO TABS
1.0000 mg | ORAL_TABLET | Freq: Three times a day (TID) | ORAL | Status: DC
  Administered 2014-10-05 (×2): 1 mg via ORAL
  Filled 2014-10-05: qty 4
  Filled 2014-10-05: qty 2

## 2014-10-05 MED ORDER — ASPIRIN 81 MG PO CHEW
324.0000 mg | CHEWABLE_TABLET | ORAL | Status: AC
  Administered 2014-10-05: 324 mg via ORAL
  Filled 2014-10-05: qty 4

## 2014-10-05 MED ORDER — ALBUTEROL SULFATE (2.5 MG/3ML) 0.083% IN NEBU: 2.5000 mg | INHALATION_SOLUTION | RESPIRATORY_TRACT | Status: DC | PRN

## 2014-10-05 MED ORDER — FLUOXETINE HCL 20 MG PO CAPS
40.0000 mg | ORAL_CAPSULE | Freq: Every day | ORAL | Status: DC
  Filled 2014-10-05: qty 2

## 2014-10-05 MED ORDER — DEXTROSE 5 % IV SOLN
1.0000 g | INTRAVENOUS | Status: DC
  Administered 2014-10-06: 1 g via INTRAVENOUS
  Filled 2014-10-05 (×2): qty 10

## 2014-10-05 MED ORDER — AZITHROMYCIN 500 MG IV SOLR
500.0000 mg | INTRAVENOUS | Status: DC
  Administered 2014-10-06 – 2014-10-07 (×2): 500 mg via INTRAVENOUS
  Filled 2014-10-05 (×2): qty 500

## 2014-10-05 MED ORDER — METRONIDAZOLE IN NACL 5-0.79 MG/ML-% IV SOLN
500.0000 mg | Freq: Three times a day (TID) | INTRAVENOUS | Status: DC
  Administered 2014-10-05 – 2014-10-07 (×5): 500 mg via INTRAVENOUS
  Filled 2014-10-05 (×7): qty 100

## 2014-10-05 NOTE — ED Notes (Addendum)
Took pt off bipap per verbal order from [place] . Pt very uncomfortable on bipap. Will also order meal tray for pt

## 2014-10-05 NOTE — H&P (Addendum)
History and Physical  Jeanne Burns NOB:[id number] DOB: 01/15/1916 DOA: 10/11/2014  Referring physician: Dr Venora Maples, ED physician PCP: Mathews Argyle, MD   Chief Complaint: Shortness of breath  HPI: Jeanne Burns is a 81 y.o. female  With a history of severe aortic stenosis, bilateral carotid stenosis with right 70% and left 50%, hyperlipidemia, anxiety, hypertension, T12 compression fracture. The patient had an aspiration episode possibly 10 days ago with intermittent coughing spells throughout the week that led to shortness of breath. The patient had been evaluated by EMS once during the week who gave her a nebulizer treatment, but noted that her lungs were clear. Last night the patient had an episode of recent shortness of breath worse with movement and exertion improved with rest. EMS was called and noted that her oxygen saturation were 80% on arrival. She received albuterol en route to the hospital. In the emergency department she was having significant wheezing and was given Solu-Medrol, nebulizer treatments and was placed on BiPAP. Her breathing has improved greatly since starting these things.   Review of Systems:   Patient notes wheezing which would lead to a coughing spell.  Pt denies any fevers, chills, nausea, vomiting, diarrhea, constipation, abdominal pain, shortness of breath, dyspnea on exertion, orthopnea, cough, wheezing, palpitations, headache, vision changes, lightheadedness, dizziness, diarrhea, constipation, melena, rectal bleeding.  Review of systems are otherwise negative  Past Medical History  Diagnosis Date  . Depression   . Hemorrhoids, external   . Macular degeneration   . Aortic stenosis     Severe on ECHO 11/03/09 AVA .75 cm2  . Insomnia   . Anxiety   . Hip fracture, left 07/2005    "no surgery; it's completely healed" &/notes 08/02/2005 (05/23/2012)  . Heart murmur     "since I was a child" (05/23/2012)  . Arthritis     "it's beginning" (05/23/2012)    . Chronic lower back pain   . Skin cancer of face     "under left side of nose" (05/23/2012)  . Expressive aphasia     "couple times recently" (05/23/2012)  . Mitral regurgitation   . Carotid stenosis     Right 70%, Left 50%  . Hypercholesterolemia     Goal LDL less tha 70  . Mandibular mass 2001    Left  . Chest discomfort     No further chest discomfort since  06/09/10  . Hypertension   . Allergic rhinitis    Past Surgical History  Procedure Laterality Date  . Cholecystectomy    . Cataract extraction w/ intraocular lens  implant, bilateral    . Submandibular gland excision Left 05/2010    "infected; taken out" (05/23/2012)  . Skin cancer excision  2010    "not long ago; off left side under my nose" (05/23/2012) Basal cell  . Tonsillectomy Left   . Breast biopsy      Benign  . Septoplasty    . Orif hip fracture Left 1997  . Dilation and curettage of uterus     Social History:  reports that she quit smoking about 36 years ago. Her smoking use included Cigarettes. She quit after 20 years of use. She has never used smokeless tobacco. She reports that she does not drink alcohol or use illicit drugs. Patient lives at  home & is able to participate in activities of daily liviwith assistance  Allergies  Allergen Reactions  . Gabapentin     Unknown reaction   . Other Other (See Comments)  Nail polish makes nails crack and bleed    Family History  Problem Relation Age of Onset  . Congestive Heart Failure Mother   . Macular degeneration Mother   . CVA Father   . Heart attack Sister   . Macular degeneration Sister   . Heart disease Brother   . Arthritis Brother      Prior to Admission medications   Medication Sig Start Date End Date Taking? Authorizing Provider  acetaminophen (TYLENOL) 500 MG tablet Take 1,000 mg by mouth 3 (three) times daily.   Yes Historical Provider, MD  ALPRAZolam Duanne Moron) 1 MG tablet Take 1 mg by mouth 3 (three) times daily.   Yes Historical  Provider, MD  Calcium Citrate-Vitamin D (CITRACAL + D PO) Take 1-2 tablets by mouth 2 (two) times daily. Calcium 600 mg/Vitamin D. 400 i.u. (2 tablets at lunch, 1 tablet at supper)   Yes Historical Provider, MD  FLUoxetine (PROZAC) 20 MG capsule Take 40 mg by mouth daily.    Yes Historical Provider, MD  Multiple Vitamin (MULTIVITAMIN WITH MINERALS) TABS Take 1 tablet by mouth daily. Centrum Silver   Yes Historical Provider, MD  Omega-3 Fatty Acids (FISH OIL) 600 MG CAPS Take 1,200 mg by mouth daily.   Yes Historical Provider, MD  Polyethyl Glycol-Propyl Glycol (SYSTANE OP) Place 1 drop into both eyes as needed (for dry eyes).   Yes Historical Provider, MD  QUEtiapine (SEROQUEL) 50 MG tablet Take 50 mg by mouth at bedtime.   Yes Historical Provider, MD  traMADol (ULTRAM) 50 MG tablet Take 50 mg by mouth 3 (three) times daily.   Yes Historical Provider, MD    Physical Exam: BP 113/38 mmHg  Pulse 92  Temp(Src) 97.7 F (36.5 C) (Oral)  Resp 20  Ht 5\' 2"  (1.575 m)  Wt 61.236 kg (135 lb)  BMI 24.69 kg/m2  SpO2 96%  GeneElderly Caucasian female. Awake and alert and oriented x3. No acute cardiopulmonary distress.  Eyes: Pupils equal, round, reactive to light. Extraocular muscles are intact. Sclerae anicteric and noninjected.  ENT: Moist mucosal membranes. No mucosal lesions.   Neck: Neck supple without lymphadenopathy. No carotid bruits. No masses palpated.  Cardiovascular: Regular rate with normal S1-S2 sounds.  4-6 ejection murmur heard best in the left upper sternal border and radiates into the right carotid.  No rubs, gallops auscultated. No JVD.  Respiratory: Good respiratory effort with no wheezes, rales, rhonchi. Lungs clear to auscultation bilaterally.  Abdomen: Soft, nontender, nondistended. Active bowel sounds. No masses or hepatosplenomegaly  Skin: Dry, warm to touch. 2+ dorsalis pedis and radial pulses. Musculoskeletal: No calf or leg pain. All major joints not erythematous  nontender.  Psychiatric: Intact judgment and insight.  Neurologic: No focal neurological deficits. Cranial nerves II through XII are grossly intact.           Labs on Admission:  Basic Metabolic Panel:  Recent Labs Lab 10/01/2014 0821  NA 134*  K 4.0  CL 98*  CO2 27  GLUCOSE 190*  BUN 18  CREATININE 0.75  CALCIUM 9.0   Liver Function Tests:  Recent Labs Lab 10/15/2014 0821  AST 34  ALT 24  ALKPHOS 65  BILITOT 1.3*  PROT 6.4*  ALBUMIN 3.7   No results for input(s): LIPASE, AMYLASE in the last 168 hours. No results for input(s): AMMONIA in the last 168 hours. CBC:  Recent Labs Lab 10/04/2014 0821  WBC 18.3*  NEUTROABS 15.2*  HGB 11.4*  HCT 35.3*  MCV 93.4  PLT 243   Cardiac Enzymes:  Recent Labs Lab 10/18/2014 0821  TROPONINI 0.08*    BNP (last 3 results) No results for input(s): BNP in the last 8760 hours.  ProBNP (last 3 results) No results for input(s): PROBNP in the last 8760 hours.  CBG: No results for input(s): GLUCAP in the last 168 hours.  Radiological Exams on Admission: Ct Chest Wo Contrast  10/23/2014   CLINICAL DATA:  Pneumonia.  History of aspiration 10 days ago.  EXAM: CT CHEST WITHOUT CONTRAST  TECHNIQUE: Multidetector CT imaging of the chest was performed following the standard protocol without IV contrast.  COMPARISON:  No CTs of the chest.  Lumbar spine MRI 04/16/2014.  FINDINGS: THORACIC INLET/BODY WALL:  No acute finding. Nodular appearance of the thyroid gland without dominant or concerning mass.  MEDIASTINUM:  Large appearance of the left atrium. No pericardial effusion. Bulky aortic valvular calcifications/sclerosis. Mitral ossification is annular based. Diffuse atherosclerosis, including the coronary arteries.  LUNG WINDOWS:  Patchy bilateral airspace disease compatible with pneumonia. Lower lobe and lingular atelectasis small pleural effusions. No notable dependent pattern of the airspace disease typical of aspiration pneumonia. Patent  major airways.  UPPER ABDOMEN:  No acute findings.  OSSEOUS:  T12 compression fracture with greater than 75% height loss centrally. There is moderate retropulsion and canal stenosis. No subluxation or posterior element fracturing. Gas cleft present within the dominant fracture plane consistent with unhealed fracture. Other superior endplate deformities are chronic appearing.  IMPRESSION: 1. Multi focal pneumonia with lower lobe atelectasis and small pleural effusions. In this patient with history of aspiration, patent central airways. 2. Unhealed T12 compression fracture. Height loss is advanced and retropulsion is moderate. 3. Advanced aortic valve calcifications/sclerosis.   Electronically Signed   By: Monte Fantasia M.D.   On: 10/02/2014 09:45   Dg Chest Portable 1 View  10/11/2014   CLINICAL DATA:  Shortness of breath and wheezing  EXAM: PORTABLE CHEST - 1 VIEW  COMPARISON:  July 18, 2014  FINDINGS: There is extensive infiltrate throughout the right mid and upper lung zones. There is mild left base atelectasis. Heart is upper normal in size with pulmonary vascularity within normal limits. No adenopathy. There is atherosclerotic change in the aorta and mitral annulus regions. There is upper lumbar levoscoliosis.  IMPRESSION: Extensive airspace opacity on the right. Aspiration could present in this manner. Bacterial pneumonia is a differential consideration. Lungs elsewhere clear except for mild left base atelectasis. Stable appearing cardiac silhouette compared to prior study.   Electronically Signed   By: Lowella Grip III M.D.   On: 10/28/2014 08:14    EKG: Independently reviewed.  Sinus tachycardia with PVCs seen. Normal intervals. LVH. No acute ST elevation.  Assessment/Plan Present on Admission:  . Respiratory failure . CAP (community acquired pneumonia) . Aspiration into airway . Elevated troponin  This patient was discussed with the ED physician, including pertinent vitals, physical exam  findings, labs, and imaging.  We also discussed care given by the ED provider.  #1 Acute respiratory failure with hypoxia  Admit to stepdown on BiPAP  Continue cardiac monitoring  We'll wean as able #2 community-acquired pneumonia  Continue Rocephin and Zithromax. Will also cover for aspiration pneumonia with metronidazole  Blood cultures  Sputum cultures  Strep antigen and Legionella antigen by urine  Check CBC in the morning #3 aspiration  We'll consult speech therapy for swallowing assessment #4 elevated troponin  Possibly chronically elevated  Recheck troponin #5 essential hypertension  diet controlled.  DVT prophylaxis: Lovenox  Consultants: none  Code Status: DO NOT RESUSCITATE  Family Communication: daughter in the room   Disposition Plan: admit to stepdown   Truett Mainland, DO [facility] Pager [phone]

## 2014-10-05 NOTE — ED Notes (Signed)
Pt on 4L [place] sats 97%.

## 2014-10-05 NOTE — ED Notes (Signed)
Pt's dtr states pt choked on her food approx 10 days ago and has been having this SOB/wheezing ever since

## 2014-10-05 NOTE — ED Notes (Signed)
Pt has been feeling SOB since 1am. Pt called on Sunday as well for SOB but did not go to hospital. Audible rhonchi. EMS gave albuterol treatment. Sats 81% on room air initially. 4L  pt sats 96%. BP 152/72, HR 112

## 2014-10-05 NOTE — Consult Note (Signed)
Consult received in ED. 81 yo with multi-focal PNA, possible chronic aspiration. Now on BiPap with respiratory failure, elevated troponin, elevated WBC and hypotension. She is frail and has aortic stenosis, depression/anxiety and chronic pain from arthritis and compression fractures.   I found patient on BiPap in moderate distress, asking me to ket her go home, she was shaking and restless, appeared quite uncomfortable with minimal stimulation or communication. Prior to my seeing and meeting with family, a plan had already been put in place to give antibiotics and BiPap a trial for 24 -48 hours to see if she had improvement. She is a DNR.  I introduced myself to the family and the role of palliative care services.. Daughter and SIL in room with caregiver from her ALF. The patient's son is Dr. Hessie Dibble who is an Materials engineer at [employer] and also Hospice and Palliative Board Certified and runs the supportive care clinic at [facility]. He is on a plane and unavailable to talk with me but discussed plan with the EDP.   My assessment is that we have a very serious PNA with a probable irreversible cause that BiPap may not be able to bridge successfully in a reasonable time frame. I am most concerned about her distress level- I think we could achieve better comfort using very low dose opiates and benzodiazapines- she normally takes Xanax 1mg  TID and Tramadol TID- these should not greatly impact her respiratory drive and may rest her enough for bipap to be more tolerable. There is little evidence to suggest that BiPap helps with comfort in multilobar PNA or spares intubation/disease progression. I have given my phone number to the family and requested a call from her son- Dr. Ilona Sorrel as soon as possible. Family has agreed to the addition of IV benzos to help with agitation and want to have the brother weigh in on Morphine.  Family report that patient has been saying that she wants to die for >30 years as  part of her "depression"- daughter also says that she really wants to get better. I let them know how serious her condition is and reviewed labs and CT images with them in detail.  Time: 4-4:50PM Greater than 50%  of this time was spent counseling and coordinating care related to the above assessment and plan.   Will follow closely.  Lane Hacker, DO Palliative Medicine' [phone]

## 2014-10-05 NOTE — ED Provider Notes (Addendum)
CSN: [id number]     Arrival date & time 10/28/2014  0973 History   First MD Initiated Contact with Patient 10/24/2014 [phone]     Chief Complaint  Patient presents with  . Shortness of Breath      The history is provided by the patient and a relative.   patient is brought to the emergency department complaining of increasing shortness of breath since approximately 1 AM.  Patient has had intermittent shortness of breath through the weekend and was evaluated by EMS at home.  Her breathing would then improve and she would not come to the emergency department.  10 days ago she had an episode where she was eating food and began having a significant coughing spell.  She's had no fevers or chills.  She's had intermittent productive cough.  EMS reports that her oxygen saturations were 80% on their arrival.  EMS began albuterol treatment in route.  No history of COPD or reactive airway disease.  She is not on oxygen at home.  Denies chest pain.  No abdominal pain.  Denies melena or hematochezia.  Patient feels as though the albuterol is improving her symptoms at this time.    Past Medical History  Diagnosis Date  . Depression   . Hemorrhoids, external   . Macular degeneration   . Aortic stenosis     Severe on ECHO 11/03/09 AVA .75 cm2  . Insomnia   . Anxiety   . Hip fracture, left 07/2005    "no surgery; it's completely healed" &/notes 08/02/2005 (05/23/2012)  . Heart murmur     "since I was a child" (05/23/2012)  . Arthritis     "it's beginning" (05/23/2012)  . Chronic lower back pain   . Skin cancer of face     "under left side of nose" (05/23/2012)  . Expressive aphasia     "couple times recently" (05/23/2012)  . Mitral regurgitation   . Carotid stenosis     Right 70%, Left 50%  . Hypercholesterolemia     Goal LDL less tha 70  . Mandibular mass 2001    Left  . Chest discomfort     No further chest discomfort since  06/09/10  . Hypertension   . Allergic rhinitis    Past Surgical History   Procedure Laterality Date  . Cholecystectomy    . Cataract extraction w/ intraocular lens  implant, bilateral    . Submandibular gland excision Left 05/2010    "infected; taken out" (05/23/2012)  . Skin cancer excision  2010    "not long ago; off left side under my nose" (05/23/2012) Basal cell  . Tonsillectomy Left   . Breast biopsy      Benign  . Septoplasty    . Orif hip fracture Left 1997  . Dilation and curettage of uterus     Family History  Problem Relation Age of Onset  . Congestive Heart Failure Mother   . Macular degeneration Mother   . CVA Father   . Heart attack Sister   . Macular degeneration Sister   . Heart disease Brother   . Arthritis Brother    Social History  Substance Use Topics  . Smoking status: Former Smoker -- 20 years    Types: Cigarettes    Quit date: 01/29/1978  . Smokeless tobacco: Never Used     Comment: 05/23/2012 'quit smoking in my 20's"  . Alcohol Use: No   OB History    No data available  Review of Systems  All other systems reviewed and are negative.     Allergies  Gabapentin and Other  Home Medications   Prior to Admission medications   Medication Sig Start Date End Date Taking? Authorizing Provider  acetaminophen (TYLENOL) 500 MG tablet Take 1,000 mg by mouth 3 (three) times daily.    Historical Provider, MD  ALPRAZolam Duanne Moron) 1 MG tablet Take 1 mg by mouth 3 (three) times daily.    Historical Provider, MD  Calcium Citrate-Vitamin D (CITRACAL + D PO) Take 1-2 tablets by mouth 2 (two) times daily. Calcium 600 mg/Vitamin D. 400 i.u. (2 tablets at lunch, 1 tablet at supper)    Historical Provider, MD  FLUoxetine (PROZAC) 20 MG capsule Take 40 mg by mouth daily.     Historical Provider, MD  Multiple Vitamin (MULTIVITAMIN WITH MINERALS) TABS Take 1 tablet by mouth daily. Centrum Silver    Historical Provider, MD  Omega-3 Fatty Acids (FISH OIL) 600 MG CAPS Take 1,200 mg by mouth daily.    Historical Provider, MD  Polyethyl  Glycol-Propyl Glycol (SYSTANE OP) Place 1 drop into both eyes as needed (for dry eyes).    Historical Provider, MD  QUEtiapine (SEROQUEL) 50 MG tablet Take 50 mg by mouth at bedtime.    Historical Provider, MD  traMADol (ULTRAM) 50 MG tablet Take 50 mg by mouth 3 (three) times daily.    Historical Provider, MD   BP 157/73 mmHg  Pulse 115  Temp(Src) 97.7 F (36.5 C) (Oral)  Resp 32  Ht 5\' 2"  (1.575 m)  Wt 135 lb (61.236 kg)  BMI 24.69 kg/m2  SpO2 98% Physical Exam  Constitutional: She is oriented to person, place, and time. She appears well-developed and well-nourished. No distress.  HENT:  Head: Normocephalic and atraumatic.  Eyes: EOM are normal.  Neck: Normal range of motion.  Cardiovascular: Regular rhythm and normal heart sounds.   Tachycardic  Pulmonary/Chest: She has wheezes.  Tachypnea.  Accessory muscle use.  Speaks in short sentences  Abdominal: Soft. She exhibits no distension. There is no tenderness.  Musculoskeletal: Normal range of motion.  Neurological: She is alert and oriented to person, place, and time.  Skin: Skin is warm and dry.  Psychiatric: She has a normal mood and affect. Judgment normal.  Nursing note and vitals reviewed.   ED Course  Procedures (including critical care time)  CRITICAL CARE Performed by: Hoy Morn Total critical care time: 32 Critical care time was exclusive of separately billable procedures and treating other patients. Critical care was necessary to treat or prevent imminent or life-threatening deterioration. Critical care was time spent personally by me on the following activities: development of treatment plan with patient and/or surrogate as well as nursing, discussions with consultants, evaluation of patient's response to treatment, examination of patient, obtaining history from patient or surrogate, ordering and performing treatments and interventions, ordering and review of laboratory studies, ordering and review of  radiographic studies, pulse oximetry and re-evaluation of patient's condition.   Labs Review Labs Reviewed  CBC WITH DIFFERENTIAL/PLATELET - Abnormal; Notable for the following:    WBC 18.3 (*)    RBC 3.78 (*)    Hemoglobin 11.4 (*)    HCT 35.3 (*)    Neutrophils Relative % 83 (*)    Neutro Abs 15.2 (*)    Lymphocytes Relative 9 (*)    Monocytes Absolute 1.4 (*)    All other components within normal limits  COMPREHENSIVE METABOLIC PANEL - Abnormal; Notable for the  following:    Sodium 134 (*)    Chloride 98 (*)    Glucose, Bld 190 (*)    Total Protein 6.4 (*)    Total Bilirubin 1.3 (*)    All other components within normal limits  TROPONIN I - Abnormal; Notable for the following:    Troponin I 0.08 (*)    All other components within normal limits  I-STAT ARTERIAL BLOOD GAS, ED - Abnormal; Notable for the following:    pCO2 arterial 45.3 (*)    Bicarbonate 25.8 (*)    All other components within normal limits    Imaging Review Ct Chest Wo Contrast  10/17/2014   CLINICAL DATA:  Pneumonia.  History of aspiration 10 days ago.  EXAM: CT CHEST WITHOUT CONTRAST  TECHNIQUE: Multidetector CT imaging of the chest was performed following the standard protocol without IV contrast.  COMPARISON:  No CTs of the chest.  Lumbar spine MRI 04/16/2014.  FINDINGS: THORACIC INLET/BODY WALL:  No acute finding. Nodular appearance of the thyroid gland without dominant or concerning mass.  MEDIASTINUM:  Large appearance of the left atrium. No pericardial effusion. Bulky aortic valvular calcifications/sclerosis. Mitral ossification is annular based. Diffuse atherosclerosis, including the coronary arteries.  LUNG WINDOWS:  Patchy bilateral airspace disease compatible with pneumonia. Lower lobe and lingular atelectasis small pleural effusions. No notable dependent pattern of the airspace disease typical of aspiration pneumonia. Patent major airways.  UPPER ABDOMEN:  No acute findings.  OSSEOUS:  T12 compression  fracture with greater than 75% height loss centrally. There is moderate retropulsion and canal stenosis. No subluxation or posterior element fracturing. Gas cleft present within the dominant fracture plane consistent with unhealed fracture. Other superior endplate deformities are chronic appearing.  IMPRESSION: 1. Multi focal pneumonia with lower lobe atelectasis and small pleural effusions. In this patient with history of aspiration, patent central airways. 2. Unhealed T12 compression fracture. Height loss is advanced and retropulsion is moderate. 3. Advanced aortic valve calcifications/sclerosis.   Electronically Signed   By: Monte Fantasia M.D.   On: 10/12/2014 09:45   Dg Chest Portable 1 View  10/28/2014   CLINICAL DATA:  Shortness of breath and wheezing  EXAM: PORTABLE CHEST - 1 VIEW  COMPARISON:  July 18, 2014  FINDINGS: There is extensive infiltrate throughout the right mid and upper lung zones. There is mild left base atelectasis. Heart is upper normal in size with pulmonary vascularity within normal limits. No adenopathy. There is atherosclerotic change in the aorta and mitral annulus regions. There is upper lumbar levoscoliosis.  IMPRESSION: Extensive airspace opacity on the right. Aspiration could present in this manner. Bacterial pneumonia is a differential consideration. Lungs elsewhere clear except for mild left base atelectasis. Stable appearing cardiac silhouette compared to prior study.   Electronically Signed   By: Lowella Grip III M.D.   On: 10/12/2014 08:14   I have personally reviewed and evaluated these images and lab results as part of my medical decision-making.   EKG Interpretation   Date/Time:  Tuesday October 05 2014 07:42:55 EDT Ventricular Rate:  114 PR Interval:  140 QRS Duration: 96 QT Interval:  324 QTC Calculation: 446 R Axis:   42 Text Interpretation:  Sinus tachycardia Ventricular premature complex LVH  with secondary repolarization abnormality nonspecific  ST and T wave  changes as compared to prior ecg Confirmed by Yehudis Monceaux  MD, Lennette Bihari ([id number]) on  10/26/2014 8:18:47 AM      MDM   Final diagnoses:  CAP (community acquired pneumonia)  Acute respiratory failure, unspecified whether with hypoxia or hypercapnia    Concerning for possible aspiration.  Concern for proximal spasm given bilateral wheezing on examination increased work of breathing with associated hypoxia.  She does seem to be improving somewhat with bronchodilators at this time.  We'll continue to monitor closely in the emergency department.  Magnesium now.  Additional bronchodilators.  Systemic steroids.  Labs and chest x-ray pending.  10:01 AM Patient is doing better on BiPAP at this time.  I had a prolonged discussion with the patient's daughter and the patient's son.  The patient's son is a Materials engineer in [place].  At this time the decision was made to not resuscitate if she would have sudden cardiac arrest and to not advance past BiPAP as they do not want the patient on the ventilator.  Until then we will remain aggressive with BiPAP and antibiotics at this time.  Admit to stepdown unit.  Palliative care consult for ongoing goals of care   Jola Schmidt, MD 10/29/2014 [place], MD 10/22/2014 1021

## 2014-10-05 NOTE — ED Notes (Signed)
Pt placed back on bipap

## 2014-10-05 NOTE — ED Notes (Signed)
Attempted report x1. 

## 2014-10-06 ENCOUNTER — Inpatient Hospital Stay (HOSPITAL_COMMUNITY): Payer: Medicare Other

## 2014-10-06 DIAGNOSIS — B370 Candidal stomatitis: Secondary | ICD-10-CM

## 2014-10-06 DIAGNOSIS — R1314 Dysphagia, pharyngoesophageal phase: Secondary | ICD-10-CM

## 2014-10-06 DIAGNOSIS — F411 Generalized anxiety disorder: Secondary | ICD-10-CM

## 2014-10-06 DIAGNOSIS — R0600 Dyspnea, unspecified: Secondary | ICD-10-CM | POA: Diagnosis present

## 2014-10-06 DIAGNOSIS — I4891 Unspecified atrial fibrillation: Secondary | ICD-10-CM | POA: Diagnosis present

## 2014-10-06 DIAGNOSIS — Z789 Other specified health status: Secondary | ICD-10-CM

## 2014-10-06 DIAGNOSIS — J189 Pneumonia, unspecified organism: Secondary | ICD-10-CM

## 2014-10-06 DIAGNOSIS — J9601 Acute respiratory failure with hypoxia: Secondary | ICD-10-CM | POA: Diagnosis present

## 2014-10-06 DIAGNOSIS — R7989 Other specified abnormal findings of blood chemistry: Secondary | ICD-10-CM

## 2014-10-06 LAB — CBC
HCT: 32.1 % — ABNORMAL LOW (ref 36.0–46.0)
Hemoglobin: 10.5 g/dL — ABNORMAL LOW (ref 12.0–15.0)
MCH: 30.2 pg (ref 26.0–34.0)
MCHC: 32.7 g/dL (ref 30.0–36.0)
MCV: 92.2 fL (ref 78.0–100.0)
PLATELETS: 203 10*3/uL (ref 150–400)
RBC: 3.48 MIL/uL — AB (ref 3.87–5.11)
RDW: 15.1 % (ref 11.5–15.5)
WBC: 13.2 10*3/uL — ABNORMAL HIGH (ref 4.0–10.5)

## 2014-10-06 LAB — BRAIN NATRIURETIC PEPTIDE: B NATRIURETIC PEPTIDE 5: 3959.8 pg/mL — AB (ref 0.0–100.0)

## 2014-10-06 MED ORDER — FUROSEMIDE 10 MG/ML IJ SOLN
20.0000 mg | Freq: Once | INTRAMUSCULAR | Status: AC
  Administered 2014-10-06: 20 mg via INTRAVENOUS
  Filled 2014-10-06: qty 2

## 2014-10-06 MED ORDER — NYSTATIN 100000 UNIT/ML MT SUSP
5.0000 mL | Freq: Four times a day (QID) | OROMUCOSAL | Status: DC
  Administered 2014-10-06 (×2): 500000 [IU] via ORAL
  Filled 2014-10-06 (×2): qty 5

## 2014-10-06 MED ORDER — MORPHINE SULFATE (PF) 2 MG/ML IV SOLN
0.5000 mg | Freq: Three times a day (TID) | INTRAVENOUS | Status: DC
  Administered 2014-10-06 (×2): 0.5 mg via INTRAVENOUS
  Filled 2014-10-06 (×2): qty 1

## 2014-10-06 MED ORDER — METHYLPREDNISOLONE SODIUM SUCC 40 MG IJ SOLR: 20.0000 mg | INTRAMUSCULAR | Status: DC

## 2014-10-06 MED ORDER — POLYVINYL ALCOHOL 1.4 % OP SOLN
2.0000 [drp] | Freq: Four times a day (QID) | OPHTHALMIC | Status: DC
  Administered 2014-10-06 (×3): 2 [drp] via OPHTHALMIC
  Filled 2014-10-06: qty 15

## 2014-10-06 MED ORDER — BISACODYL 10 MG RE SUPP: 10.0000 mg | Freq: Every day | RECTAL | Status: DC | PRN

## 2014-10-06 MED ORDER — BIOTENE DRY MOUTH MT LIQD: 15.0000 mL | OROMUCOSAL | Status: DC | PRN

## 2014-10-06 MED ORDER — DIAZEPAM 5 MG/ML IJ SOLN
2.5000 mg | Freq: Three times a day (TID) | INTRAMUSCULAR | Status: DC
  Administered 2014-10-06: 2.5 mg via INTRAVENOUS
  Filled 2014-10-06: qty 2

## 2014-10-06 MED ORDER — DIAZEPAM 5 MG/ML IJ SOLN
5.0000 mg | INTRAMUSCULAR | Status: DC | PRN
  Administered 2014-10-06: 5 mg via INTRAVENOUS
  Filled 2014-10-06: qty 2

## 2014-10-06 MED ORDER — DIAZEPAM 5 MG/ML IJ SOLN
2.5000 mg | INTRAMUSCULAR | Status: DC | PRN
  Administered 2014-10-06 (×2): 2.5 mg via INTRAVENOUS
  Administered 2014-10-07: 5 mg via INTRAVENOUS
  Administered 2014-10-07: 2.5 mg via INTRAVENOUS
  Filled 2014-10-06 (×3): qty 2

## 2014-10-06 MED ORDER — MORPHINE SULFATE (PF) 2 MG/ML IV SOLN
0.5000 mg | INTRAVENOUS | Status: DC | PRN
  Administered 2014-10-06 (×2): 1 mg via INTRAVENOUS
  Administered 2014-10-06 (×2): 0.5 mg via INTRAVENOUS
  Administered 2014-10-06 – 2014-10-07 (×5): 1 mg via INTRAVENOUS
  Filled 2014-10-06 (×7): qty 1

## 2014-10-06 MED ORDER — METHYLPREDNISOLONE SODIUM SUCC 125 MG IJ SOLR: 60.0000 mg | INTRAMUSCULAR | Status: DC

## 2014-10-06 MED ORDER — MORPHINE SULFATE (PF) 2 MG/ML IV SOLN: 0.5000 mg | INTRAVENOUS | Status: DC | PRN

## 2014-10-06 NOTE — Progress Notes (Signed)
[facility] PROGRESS NOTE  [name] MOQ:[id number] DOB: Feb 14, 1915 DOA: 10/15/2014 PCP: Mathews Argyle, MD  Assessment/Plan: 1-acute resp failure with hypoxia: due to multifocal PNA -patient failing treatment with antibiotics and BIPAP -after discussing with PC service, will attempt comfort approach along with antibiotics for another 24 hours -if patient continue failing and experiencing discomfort will transition to full comfort -plan is to wean her off BIPAP today -having trouble with pills, will continue holding PO's due to high risk for aspiration   2-anxiety/depression:  -will continue valium IV  3-vascular congestion: seen on x-ray and most likely secondary to PNA in underlying chronic diastolic HF and aortic stenosis  -will give lasix IV X 1 -follow response -daily weight -IVF's changed to 10-56ml/hr  4-elevated troponin: most likely due to demand ischemia -will continue tx for PNAa nd use of ASA -patient w/o CP.  5-thrush: started on nystatin    Code Status: DNR/DNI Family Communication: care giver at bedside Disposition Plan: remains inpatient, continue on stepdown for now; [facility] team discussing with family for further decisions, interventions and plan of care.   Consultants:  Palliative Care   Procedures:  See below for x-ray reports   Antibiotics:  Rocephin, Zithromax, Flagyl   HPI/Subjective: In moderate distress due to SOB. HR in mid 30's and using accessory muscles. BIPAP in place. No fever. No CP.  Objective: Filed Vitals:   10/06/14 1200  BP: 127/89  Pulse: 70  Temp: 98.3 F (36.8 C)  Resp: 24    Intake/Output Summary (Last 24 hours) at 10/06/14 1428 Last data filed at 10/06/14 1210  Gross per 24 hour  Intake    625 ml  Output      0 ml  Net    625 ml   Filed Weights   10/08/2014 0740 10/06/14 0500  Weight: 61.236 kg (135 lb) 61.1 kg (134 lb 11.2 oz)    Exam:   General:  Afebrile; tachypneic, complaining of  difficulty breathing and not tolerating bipap well. Patient denies CP. In moderate distress due to SOB. Staff reported shocking episodes while attempting pills early this morning.  Cardiovascular: S1 and s2, no rubs orgallops  Respiratory: tachypnea, using accessory muscles and with shallow breathing. Patient with BIPAP in place, but complaining of intolerance.  Abdomen: soft, NT, ND, positive BS  Musculoskeletal: no edema, no cyanosis, no clubbing    Data Reviewed: Basic Metabolic Panel:  Recent Labs Lab 10/24/2014 0821  NA 134*  K 4.0  CL 98*  CO2 27  GLUCOSE 190*  BUN 18  CREATININE 0.75  CALCIUM 9.0   Liver Function Tests:  Recent Labs Lab 10/28/2014 0821  AST 34  ALT 24  ALKPHOS 65  BILITOT 1.3*  PROT 6.4*  ALBUMIN 3.7   CBC:  Recent Labs Lab 10/20/2014 0821 10/06/14 0253  WBC 18.3* 13.2*  NEUTROABS 15.2*  --   HGB 11.4* 10.5*  HCT 35.3* 32.1*  MCV 93.4 92.2  PLT 243 203   Cardiac Enzymes:  Recent Labs Lab 10/15/2014 0821 10/16/2014 1814  TROPONINI 0.08* 0.25*   BNP (last 3 results)  Recent Labs  10/06/14 0253  BNP 3959.8*    Recent Results (from the past 240 hour(s))  Culture, blood (routine x 2)     Status: None (Preliminary result)   Collection Time: 10/27/2014  2:09 PM  Result Value Ref Range Status   Specimen Description BLOOD RIGHT HAND  Final   Special Requests BOTTLES DRAWN AEROBIC AND ANAEROBIC 4CC  Final  Culture NO GROWTH < 24 HOURS  Final   Report Status PENDING  Incomplete  Culture, blood (routine x 2)     Status: None (Preliminary result)   Collection Time: 10/19/2014  2:17 PM  Result Value Ref Range Status   Specimen Description BLOOD LEFT ARM  Final   Special Requests BOTTLES DRAWN AEROBIC AND ANAEROBIC 5CC  Final   Culture NO GROWTH < 24 HOURS  Final   Report Status PENDING  Incomplete  MRSA PCR Screening     Status: None   Collection Time: 10/06/2014  4:45 PM  Result Value Ref Range Status   MRSA by PCR NEGATIVE NEGATIVE  Final    Comment:        The GeneXpert MRSA Assay (FDA approved for NASAL specimens only), is one component of a comprehensive MRSA colonization surveillance program. It is not intended to diagnose MRSA infection nor to guide or monitor treatment for MRSA infections.      Studies: Ct Chest Wo Contrast  10/01/2014   CLINICAL DATA:  Pneumonia.  History of aspiration 10 days ago.  EXAM: CT CHEST WITHOUT CONTRAST  TECHNIQUE: Multidetector CT imaging of the chest was performed following the standard protocol without IV contrast.  COMPARISON:  No CTs of the chest.  Lumbar spine MRI 04/16/2014.  FINDINGS: THORACIC INLET/BODY WALL:  No acute finding. Nodular appearance of the thyroid gland without dominant or concerning mass.  MEDIASTINUM:  Large appearance of the left atrium. No pericardial effusion. Bulky aortic valvular calcifications/sclerosis. Mitral ossification is annular based. Diffuse atherosclerosis, including the coronary arteries.  LUNG WINDOWS:  Patchy bilateral airspace disease compatible with pneumonia. Lower lobe and lingular atelectasis small pleural effusions. No notable dependent pattern of the airspace disease typical of aspiration pneumonia. Patent major airways.  UPPER ABDOMEN:  No acute findings.  OSSEOUS:  T12 compression fracture with greater than 75% height loss centrally. There is moderate retropulsion and canal stenosis. No subluxation or posterior element fracturing. Gas cleft present within the dominant fracture plane consistent with unhealed fracture. Other superior endplate deformities are chronic appearing.  IMPRESSION: 1. Multi focal pneumonia with lower lobe atelectasis and small pleural effusions. In this patient with history of aspiration, patent central airways. 2. Unhealed T12 compression fracture. Height loss is advanced and retropulsion is moderate. 3. Advanced aortic valve calcifications/sclerosis.   Electronically Signed   By: Monte Fantasia M.D.   On: 10/08/2014  09:45   Dg Chest Port 1 View  10/06/2014   CLINICAL DATA:  Acute shortness of breath.  EXAM: PORTABLE CHEST - 1 VIEW  COMPARISON:  09/30/2014 and prior radiographs  FINDINGS: Cardiomegaly noted.  Increasing diffuse bilateral airspace opacities are noted.  There is no evidence of pneumothorax.  No other significant change identified.  IMPRESSION: Increasing diffuse bilateral airspace opacities which may represent edema, pneumonia and/ or aspiration.   Electronically Signed   By: Margarette Canada M.D.   On: 10/06/2014 13:17   Dg Chest Portable 1 View  10/26/2014   CLINICAL DATA:  Shortness of breath and wheezing  EXAM: PORTABLE CHEST - 1 VIEW  COMPARISON:  July 18, 2014  FINDINGS: There is extensive infiltrate throughout the right mid and upper lung zones. There is mild left base atelectasis. Heart is upper normal in size with pulmonary vascularity within normal limits. No adenopathy. There is atherosclerotic change in the aorta and mitral annulus regions. There is upper lumbar levoscoliosis.  IMPRESSION: Extensive airspace opacity on the right. Aspiration could present in this manner. Bacterial  pneumonia is a differential consideration. Lungs elsewhere clear except for mild left base atelectasis. Stable appearing cardiac silhouette compared to prior study.   Electronically Signed   By: Lowella Grip III M.D.   On: 09/30/2014 08:14    Scheduled Meds: . albuterol  2.5 mg Nebulization Q6H  . azithromycin  500 mg Intravenous Q24H  . cefTRIAXone (ROCEPHIN)  IV  1 g Intravenous Q24H  . enoxaparin (LOVENOX) injection  40 mg Subcutaneous Q24H  . metronidazole  500 mg Intravenous Q8H  . pantoprazole (PROTONIX) IV  40 mg Intravenous QHS  . polyvinyl alcohol  2 drop Both Eyes QID   Continuous Infusions:   Active Problems:   Respiratory failure   CAP (community acquired pneumonia)   Aspiration into airway   Elevated troponin   Anxiety state   Dyspnea   Atrial fibrillation   Thrush   Dysphagia,  pharyngoesophageal phase    Time spent: 30 minutes     Barton Dubois  [facility] Pager [phone]. If 7PM-7AM, please contact night-coverage at [email or web address], password [facility] 10/06/2014, 2:28 PM  LOS: 1 day

## 2014-10-06 NOTE — Progress Notes (Signed)
Utilization Review Completed.  

## 2014-10-06 NOTE — Progress Notes (Signed)
C/o shortness of breath. Bipap mask readjusted for comfort & maximum comfort . Using Accessory muscles. Valium 5 mg IV given for anxiety. Called RT to increase 02 . sats hanging around 88. RR 30's. Sats mucgh better after 10 min on increased 02 . Pt went from 40 % to 55 % on Bipap RR down to 28

## 2014-10-06 NOTE — Progress Notes (Signed)
DR Hilma Favors took pt off bipap placed on 6 l n/c tolerating well sats 93 % and RR 20 still has shallow breaths .

## 2014-10-06 NOTE — Progress Notes (Signed)
Pt uncomfortable saying I just want to die - Given 1 mg of MC IV and 2.5 mg of Valium In & 20 mg Lasix. Dr Hilma Favors at bedside [place] CXR done.

## 2014-10-06 NOTE — Progress Notes (Signed)
SLP Cancellation Note  Patient Details Name: BRENNA FRIESENHAHN MRN: [id number] DOB: 1915-12-10   Cancelled treatment:       Reason Eval/Treat Not Completed: Medical issues which prohibited therapy.  Patient on Bipap and not ready for bedside swallow evaluation per RN. Cancel SLP today. Will check back tomorrow for readiness.  Gunnar Fusi, M.A., [place] 10/06/2014, 11:35 AM

## 2014-10-06 NOTE — Progress Notes (Signed)
PT Cancellation Note  Patient Details Name: Jeanne Burns MRN: [id number] DOB: 08-26-1915   Cancelled Treatment:    Reason Eval/Treat Not Completed: Medical issues which prohibited therapy (Pt on Bipap and using accessory muscles to breathe per nurse)  Cancel PT today.  Will check back tomorrow.  Thanks.   Irwin Brakeman F 10/06/2014, 9:34 AM [facility] [phone] (pager)

## 2014-10-06 NOTE — Progress Notes (Signed)
Pt is under palliative care of DR Hilma Favors.. Pt wants to have Bipap off MD instructed that she will be at bedside and that pt and her family want her to be comfortable. Pt given Valium IV 2.5 mg at 1307 & 1444  Out of same vial & IV morphine 1 mg @ 1306, 0.5 mg  IV morphine given @ 1333 & 1350 out of same vial. Pt tolerating being off bipap, placed on Ventimask & transitioned to nonrebreather mask to keep sats at or greater to 90 % and resp rate under 28.

## 2014-10-06 NOTE — Progress Notes (Signed)
Pt inc of urine mod amt. Turned to provided  peri hygiene and sats dropped placed on venti-mask at 10 l delivers 55 % fi02. Recovering slowly. Sats dropped to 77 % now back up to 88 %. Family aware of pt's delicate condition - discussed inlength with Dr Hilma Favors on family & pt's wishes & medical treatmant.

## 2014-10-06 NOTE — Progress Notes (Addendum)
Met with patient's son and daughter and had extensive goals of care conversation. Mrs. Killilea has multifocal PNA, severe aortic stenosis, rising troponins- she is frail and 81 years old- ambulatory at home with a walker but history of chronic aspiration.  Assessment today: 1. Condition has worsened 2. CXR looks much worse- developing pulmonary edema 3. She is tired and in obvious distress-begging now for this to be over  Family agree that comfort is the best approach if her condition worsens. They have committed to 48 hours of aggressive interventions to see if she can get better- notably they are somewhat desensitized to her distress because she has been largely characterized by them as someone who complained and was in general nihilistic. They have agreed to using carefully administer comfort medications. She has not had a trial off of bipap with comfort meds given.  I personally supervised and guided removal of the bipap and worked closely with RN on a plan for administration of IV medication for comfort based on her O2 stas, RR, and level of distress.  I strongly recommended not placing bipap back on this patient-it really is not a bridge for multifocal PNA and patient refuses saying it is intolerable. Not in line with comfort care.  I recommend frequent small doses of PRN morphine for dyspnea q15 for dustress and 2.$RemoveBefore'5mg'[id number]$  of valium q2PRN based on her needs. I performed mouth care on the patient and provided comfort and support- spoke again with family and updated them on her condition- she has so far tolerated removal of bipap using PRN comfort medications to help with anxiety and dyspnea.  CXR ordered portable and Lasix given for pulmonary edema. D/C steroids- making agitation worse. Stopped DM cough medicine-contraindicated in elderly.  I will follow closely. If she deteriorates please consider conferencing with family about futility of escalating interventions. Will continue antibiotics for  now.  Her tongue and buccal surfaces have white patches and her tongue is black- will treat empirically for thrush given her swallowing problems- she reports mouth pain.  Orders placed.   Time: 11:00-1:50 Total: 170 minutes Greater than 50%  of this time was spent counseling and coordinating care related to the above assessment and plan.

## 2014-10-06 NOTE — Progress Notes (Signed)
Pt not tolerating Bipap asking for it to be removed. Bipap removed and pt placed on 4lpm . SPO2 95% HR105.

## 2014-10-07 DIAGNOSIS — Z515 Encounter for palliative care: Secondary | ICD-10-CM | POA: Insufficient documentation

## 2014-10-07 DIAGNOSIS — Z66 Do not resuscitate: Secondary | ICD-10-CM | POA: Diagnosis present

## 2014-10-07 DIAGNOSIS — I4891 Unspecified atrial fibrillation: Secondary | ICD-10-CM

## 2014-10-07 MED ORDER — MORPHINE BOLUS VIA INFUSION
2.0000 mg | INTRAVENOUS | Status: DC | PRN
  Filled 2014-10-07: qty 2

## 2014-10-07 MED ORDER — SODIUM CHLORIDE 0.9 % IV SOLN
0.5000 mg/h | INTRAVENOUS | Status: DC
  Administered 2014-10-07: 2 mg/h via INTRAVENOUS
  Filled 2014-10-07: qty 10

## 2014-10-07 MED ORDER — ZOLPIDEM TARTRATE 5 MG PO TABS: 5.0000 mg | ORAL_TABLET | Freq: Once | ORAL | Status: DC

## 2014-10-07 MED ORDER — DIAZEPAM 5 MG/ML IJ SOLN: 5.0000 mg | INTRAMUSCULAR | Status: DC | PRN

## 2014-10-07 MED ORDER — FUROSEMIDE 10 MG/ML IJ SOLN
INTRAMUSCULAR | Status: AC
  Filled 2014-10-07: qty 4

## 2014-10-07 MED ORDER — FUROSEMIDE 10 MG/ML IJ SOLN
40.0000 mg | Freq: Once | INTRAMUSCULAR | Status: AC
  Administered 2014-10-07: 40 mg via INTRAVENOUS

## 2014-10-07 MED ORDER — QUETIAPINE FUMARATE 50 MG PO TABS
50.0000 mg | ORAL_TABLET | Freq: Every day | ORAL | Status: DC
  Administered 2014-10-07: 50 mg via ORAL
  Filled 2014-10-07: qty 1

## 2014-10-07 MED ORDER — DIAZEPAM 5 MG/ML IJ SOLN: 5.0000 mg | Freq: Three times a day (TID) | INTRAMUSCULAR | Status: DC

## 2014-10-07 MED ORDER — SODIUM CHLORIDE 0.9 % IV SOLN
1.0000 mg/h | INTRAVENOUS | Status: DC
  Administered 2014-10-07 (×2): 0.5 mg/h via INTRAVENOUS
  Filled 2014-10-07: qty 10

## 2014-10-10 LAB — CULTURE, BLOOD (ROUTINE X 2)
CULTURE: NO GROWTH
Culture: NO GROWTH

## 2014-10-30 NOTE — Progress Notes (Signed)
Family at bedside; pt appears comfortable.

## 2014-10-30 NOTE — Progress Notes (Signed)
Daily Progress Note   Patient Name: Jeanne Burns       Date: 2014/10/28 DOB: March 13, 1915  Age: 81 y.o. MRN#: [id number] Attending Physician: Barton Dubois, MD Primary Care Physician: Mathews Argyle, MD Admit Date: 10/19/2014  Reason for Consultation/Follow-up: Terminal care  Subjective: Declining today. Agitated, calling out to help her. Family at bedside. Accepting that she is at EOL.  Interval Events: Admitted 9/6- PMT consult 9/6 Length of Stay: 2 days  Current Medications: Scheduled Meds:  . albuterol  2.5 mg Nebulization Q6H  . diazepam  5 mg Intravenous TID  . polyvinyl alcohol  2 drop Both Eyes QID    Continuous Infusions: . morphine 1 mg/hr (10/28/2014 1109)    PRN Meds: sodium chloride, albuterol, antiseptic oral rinse, bisacodyl, diazepam, morphine, ondansetron (ZOFRAN) IV  Palliative Performance Scale: 20 %     Vital Signs: BP 90/54 mmHg  Pulse 103  Temp(Src) 99.1 F (37.3 C) (Axillary)  Resp 22  Ht 5\' 2"  (1.575 m)  Wt 63.9 kg (140 lb 14 oz)  BMI 25.76 kg/m2  SpO2 92% SpO2: SpO2: 92 % O2 Device: O2 Device: NRB O2 Flow Rate: O2 Flow Rate (L/min): 15 L/min  Intake/output summary:  Intake/Output Summary (Last 24 hours) at 10-28-14 1422 Last data filed at 10-28-14 0700  Gross per 24 hour  Intake 920.33 ml  Output      0 ml  Net 920.33 ml   LBM:   Baseline Weight: Weight: 61.236 kg (135 lb) Most recent weight: Weight: 63.9 kg (140 lb 14 oz)  Physical Exam: Agitated, Dyspnea, Tachypnea, Accessory muscle use  Additional Data Reviewed: Recent Labs     10/10/2014  0821  10/06/14  0253  WBC  18.3*  13.2*  HGB  11.4*  10.5*  PLT  243  203  NA  134*   --   BUN  18   --   CREATININE  0.75   --      Problem List:  Patient Active Problem List   Diagnosis Date Noted  . Anxiety state 10/06/2014  . Dyspnea 10/06/2014  . Atrial fibrillation 10/06/2014  . Thrush 10/06/2014  . Dysphagia, pharyngoesophageal phase 10/06/2014  . Acute  respiratory failure with hypoxia   . Respiratory failure 10/26/2014  . CAP (community acquired pneumonia) 10/26/2014  . Aspiration into airway 10/09/2014  . Elevated troponin 10/03/2014  . Critical aortic valve stenosis 04/16/2013  . Bilateral carotid artery stenosis 04/16/2013  . Essential hypertension 04/16/2013  . Macular degeneration 04/16/2013  . Word finding difficulty 05/23/2012     Palliative Care Assessment & Plan    Code Status:  DNR  Goals of Care:  I conferenced with family again and they are ready for full comfort care- all the family are coming into town,  Symptom Management:  Initiated Morphine infusion with titration parameters and liberal bolus dosing   Continue IV diazepam scheduled and PRN  Stop antibiotics  Palliative Prophylaxis:  Eye care, oral care  Psycho-social/Spiritual:  Desire for further Chaplaincy support:yes   Prognosis: Hours - Days Discharge Planning: Anticipate hospital death   Care plan was discussed with Dr. Mardi Mainland RN  Thank you for allowing the Palliative Medicine Team to assist in the care of this patient.   Time In: 8:30AM Time Out: 9:15AM Total Time 45 minutes Prolonged Time Billed no    Greater than 50%  of this time was spent counseling and coordinating care related to the above assessment and plan.   Benjamine Mola  Sharlee Blew, DO  10-13-14, 2:22 PM  Please contact Palliative Medicine Team phone at [phone] for questions and concerns.

## 2014-10-30 NOTE — Progress Notes (Signed)
Wasted Morphine drip approx 156ml and witnessed by Donnella Bi, RN

## 2014-10-30 NOTE — Progress Notes (Signed)
Dr. Philomena Doheny paged. Pt with out resp or pulse. Iv morphine turned off at this time. Family at the bedside. Pronounced by this nurse and Donnella Bi, R.N.

## 2014-10-30 NOTE — Progress Notes (Signed)
Dr. Philomena Doheny updated of time of death and death certificate sent to him on [place] for completion.

## 2014-10-30 NOTE — Progress Notes (Signed)
[facility] PROGRESS NOTE  Jeanne Burns YBO:[id number] DOB: January 30, 1915 DOA: 10/13/2014 PCP: Mathews Argyle, MD  Assessment/Plan: 1-acute resp failure with hypoxia: due to multifocal PNA -patient continue to be in resp distress and requiring NRB mask for O2 supplementation  -patient has failed responding to antibiotics and supportive treatment -discussion to transfer to full comfort today between [facility] and family; will follow outcome and rec's -having trouble with pills, will continue holding PO's meds and restricting diet initially due to high risk for aspiration and ongoing resp distress -if full comfort decided will allow comfort feeding as part of management   2-anxiety/depression:  -will continue valium IV  3-vascular congestion: seen on x-ray and most likely secondary to PNA on underlying chronic diastolic HF and aortic stenosis  -will give lasix IV X 1 -follow response -daily weight -IVF's changed to 10-61ml/hr  4-elevated troponin: most likely due to demand ischemia -will continue tx for PNA and use of ASA -patient w/o CP. -discussion to transfer to full comfort today between PC and family; will follow outcome and rec's  5-thrush: will continue nystatin    Code Status: DNR/DNI Family Communication: son and daughter at bedside Disposition Plan: remains inpatient, continue on stepdown for now; [facility] team discussing with family for further decisions, interventions and plan of care.   Consultants:  Palliative Care   Procedures:  See below for x-ray reports   Antibiotics:  Rocephin, Zithromax, Flagyl   HPI/Subjective: In moderate distress due to SOB and unable to speak in full sentences. Overnight requiring NRB mask 100% oxygen supplementation. No fever.  Objective: Filed Vitals:   10/20/14 0812  BP: 90/54  Pulse: 103  Temp: 99.1 F (37.3 C)  Resp: 22    Intake/Output Summary (Last 24 hours) at Oct 20, 2014 0848 Last data filed at 2014/10/20 0700   Gross per 24 hour  Intake 1070.33 ml  Output      0 ml  Net 1070.33 ml   Filed Weights   10/12/2014 0740 10/06/14 0500 10/20/2014 0500  Weight: 61.236 kg (135 lb) 61.1 kg (134 lb 11.2 oz) 63.9 kg (140 lb 14 oz)    Exam:   General:  Afebrile; continue to be tachypneic and in moderate resp distress. Patient is on NRB 100% and O2 sat in 90-92%. Patient denies CP. Patient is thirsty and no comfortable.  Cardiovascular: S1 and s2, no rubs orgallops  Respiratory: tachypnea, using accessory muscles and unable to speak in full sentences due to SOB. Positive rhonchi and fine bibasilar crackles. No wheezing  Abdomen: soft, NT, ND, positive BS  Musculoskeletal: no edema, no cyanosis, no clubbing    Data Reviewed: Basic Metabolic Panel:  Recent Labs Lab 10/24/2014 0821  NA 134*  K 4.0  CL 98*  CO2 27  GLUCOSE 190*  BUN 18  CREATININE 0.75  CALCIUM 9.0   Liver Function Tests:  Recent Labs Lab 10/10/2014 0821  AST 34  ALT 24  ALKPHOS 65  BILITOT 1.3*  PROT 6.4*  ALBUMIN 3.7   CBC:  Recent Labs Lab 10/18/2014 0821 10/06/14 0253  WBC 18.3* 13.2*  NEUTROABS 15.2*  --   HGB 11.4* 10.5*  HCT 35.3* 32.1*  MCV 93.4 92.2  PLT 243 203   Cardiac Enzymes:  Recent Labs Lab 10/09/2014 0821 10/13/2014 1814  TROPONINI 0.08* 0.25*   BNP (last 3 results)  Recent Labs  10/06/14 0253  BNP 3959.8*    Recent Results (from the past 240 hour(s))  Culture, blood (routine x 2)  Status: None (Preliminary result)   Collection Time: 10/09/2014  2:09 PM  Result Value Ref Range Status   Specimen Description BLOOD RIGHT HAND  Final   Special Requests BOTTLES DRAWN AEROBIC AND ANAEROBIC 4CC  Final   Culture NO GROWTH < 24 HOURS  Final   Report Status PENDING  Incomplete  Culture, blood (routine x 2)     Status: None (Preliminary result)   Collection Time: 10/16/2014  2:17 PM  Result Value Ref Range Status   Specimen Description BLOOD LEFT ARM  Final   Special Requests BOTTLES DRAWN  AEROBIC AND ANAEROBIC 5CC  Final   Culture NO GROWTH < 24 HOURS  Final   Report Status PENDING  Incomplete  MRSA PCR Screening     Status: None   Collection Time: 09/30/2014  4:45 PM  Result Value Ref Range Status   MRSA by PCR NEGATIVE NEGATIVE Final    Comment:        The GeneXpert MRSA Assay (FDA approved for NASAL specimens only), is one component of a comprehensive MRSA colonization surveillance program. It is not intended to diagnose MRSA infection nor to guide or monitor treatment for MRSA infections.      Studies: Ct Chest Wo Contrast  10/11/2014   CLINICAL DATA:  Pneumonia.  History of aspiration 10 days ago.  EXAM: CT CHEST WITHOUT CONTRAST  TECHNIQUE: Multidetector CT imaging of the chest was performed following the standard protocol without IV contrast.  COMPARISON:  No CTs of the chest.  Lumbar spine MRI 04/16/2014.  FINDINGS: THORACIC INLET/BODY WALL:  No acute finding. Nodular appearance of the thyroid gland without dominant or concerning mass.  MEDIASTINUM:  Large appearance of the left atrium. No pericardial effusion. Bulky aortic valvular calcifications/sclerosis. Mitral ossification is annular based. Diffuse atherosclerosis, including the coronary arteries.  LUNG WINDOWS:  Patchy bilateral airspace disease compatible with pneumonia. Lower lobe and lingular atelectasis small pleural effusions. No notable dependent pattern of the airspace disease typical of aspiration pneumonia. Patent major airways.  UPPER ABDOMEN:  No acute findings.  OSSEOUS:  T12 compression fracture with greater than 75% height loss centrally. There is moderate retropulsion and canal stenosis. No subluxation or posterior element fracturing. Gas cleft present within the dominant fracture plane consistent with unhealed fracture. Other superior endplate deformities are chronic appearing.  IMPRESSION: 1. Multi focal pneumonia with lower lobe atelectasis and small pleural effusions. In this patient with history of  aspiration, patent central airways. 2. Unhealed T12 compression fracture. Height loss is advanced and retropulsion is moderate. 3. Advanced aortic valve calcifications/sclerosis.   Electronically Signed   By: Monte Fantasia M.D.   On: 10/14/2014 09:45   Dg Chest Port 1 View  10/06/2014   CLINICAL DATA:  Acute shortness of breath.  EXAM: PORTABLE CHEST - 1 VIEW  COMPARISON:  10/28/2014 and prior radiographs  FINDINGS: Cardiomegaly noted.  Increasing diffuse bilateral airspace opacities are noted.  There is no evidence of pneumothorax.  No other significant change identified.  IMPRESSION: Increasing diffuse bilateral airspace opacities which may represent edema, pneumonia and/ or aspiration.   Electronically Signed   By: Margarette Canada M.D.   On: 10/06/2014 13:17    Scheduled Meds: . albuterol  2.5 mg Nebulization Q6H  . azithromycin  500 mg Intravenous Q24H  . cefTRIAXone (ROCEPHIN)  IV  1 g Intravenous Q24H  . diazepam  2.5 mg Intravenous TID  . enoxaparin (LOVENOX) injection  40 mg Subcutaneous Q24H  . metronidazole  500 mg Intravenous  Q8H  .  morphine injection  0.5 mg Intravenous TID  . nystatin  5 mL Oral QID  . pantoprazole (PROTONIX) IV  40 mg Intravenous QHS  . polyvinyl alcohol  2 drop Both Eyes QID   Continuous Infusions:   Active Problems:   Respiratory failure   CAP (community acquired pneumonia)   Aspiration into airway   Elevated troponin   Anxiety state   Dyspnea   Atrial fibrillation   Thrush   Dysphagia, pharyngoesophageal phase   Acute respiratory failure with hypoxia    Time spent: 35 minutes (>50% dedicated to face to face evaluation, family discussion at bedside for plan of care and updates)    Barton Dubois  [facility] Pager [phone]. If 7PM-7AM, please contact night-coverage at [email or web address], password [facility] Oct 11, 2014, 8:48 AM  LOS: 2 days

## 2014-10-30 NOTE — Progress Notes (Signed)
Family continues to be at bedside. Morphine drip infusing per order.

## 2014-10-30 NOTE — Procedures (Signed)
Treatment stopped due to pt's increase WOB.  BiPAP use refused by pt.  RN and rapid Therapist, sports notified.

## 2014-10-30 NOTE — Progress Notes (Signed)
Dr. Armandina Gemma at bedside for comfort care. Family present at bedside. Med as needed for discomfort. See MAR for meds given. Nonrebreather removed and placed on venti-mask. Notifed CCMD that patient has been placed on comfort care.

## 2014-10-30 NOTE — Discharge Summary (Addendum)
Death Summary  Jeanne Burns PNT:[id number] DOB: December 15, 1915 DOA: 24-Oct-2014  PCP: Mathews Argyle, MD PCP/Office notified: through electronic system (EPIC)  Admit date: 10-24-14 Date of Death: 10-26-2014  Final Diagnoses:  Active Problems:   Respiratory failure   CAP (community acquired pneumonia)   Aspiration into airway   Elevated troponin   Anxiety state   Dyspnea   Atrial fibrillation   Thrush   Dysphagia, pharyngoesophageal phase   Acute respiratory failure with hypoxia   DNR (do not resuscitate)   Palliative care by specialist Acute on chronic diastolic heart failure  History of present illness:  80 y.o. female  With a history of severe aortic stenosis, bilateral carotid stenosis with right 70% and left 50%, hyperlipidemia, anxiety, hypertension, T12 compression fracture. The patient had an aspiration episode possibly 10 days ago with intermittent coughing spells throughout the week that led to shortness of breath. The patient had been evaluated by EMS once during the week who gave her a nebulizer treatment, but noted that her lungs were clear. Last night the patient had an episode of recent shortness of breath worse with movement and exertion improved with rest. EMS was called and noted that her oxygen saturation were 80% on arrival. She received albuterol en route to the hospital. In the emergency department she was having significant wheezing and was given Solu-Medrol, nebulizer treatments and was placed on BiPAP. Her breathing has improved greatly since starting these things.   Hospital Course:  1-acute resp failure with hypoxia: due to multifocal aspiration PNA -patient continue to be in respiratory distress and requiring NRB mask for O2 supplementation and patient itself decline use of BIPAP -patient has essentially failed responding to antibiotics and supportive treatment -discussion to transition to full comfort care happened today between [facility] and family and  decision was take to discontinue NRB mask, stop antibiotics and initiate morphine infusion, with PRN use of valium and Antiemetics/ biotene for oral care. -patient expired at 16:02  2-anxiety/depression:  -given dysphagia and difficulty swallowing was placed on valium IV  3-pulmonary vascular congestion/acute on chronic diastolic HF: seen on x-ray and most likely secondary to PNA and underlying chronic diastolic HF and aortic stenosis  -patient received 2 separate doses of lasix IV; BP become soft; minimal improvement with therapy to her SOB -daily weight and strict I's and O's were followed -presumed diastolic HF due to aortic stenosis, PAF and prolonged hx of HTN.   4-elevated troponin: most likely due to demand ischemia  -patient received tx for PNA and was on ASA -patient w/o CP and not really a candidate for any further invasive intervention given cormobidities and age. -discussion to transition to full comfort happened today between [facility] and family; decision was to keep patient comfortable and morphine drip was initiated.  5-thrush: was treated with nystatin   6-Paroxysmal atrial fibrillation: most likely due to severe aortic stenosis  -not a candidate for anticoagulation -was not on any rate control agent prior to admission   Time: 30 minutes  Signed:  Barton Dubois  [facility] Oct 26, 2014, 4:50 PM

## 2014-10-30 DEATH — deceased

## 2017-01-23 IMAGING — CT CT CHEST W/O CM
1 of 3 series · 4 of 36 positions shown, 5 images · non-contrast
Comparison: No CTs of the chest.  Lumbar spine MRI 04/16/2014.

CLINICAL DATA: Pneumonia.  History of aspiration 10 days ago.

EXAM:
CT CHEST WITHOUT CONTRAST
TECHNIQUE: Multidetector CT imaging of the chest was performed following the
standard protocol without IV contrast.

[Series 204: cor · coronal · 0.49mm/px · 4 of 116 slices shown, 5 images]
[im 24/116  mediastinal]
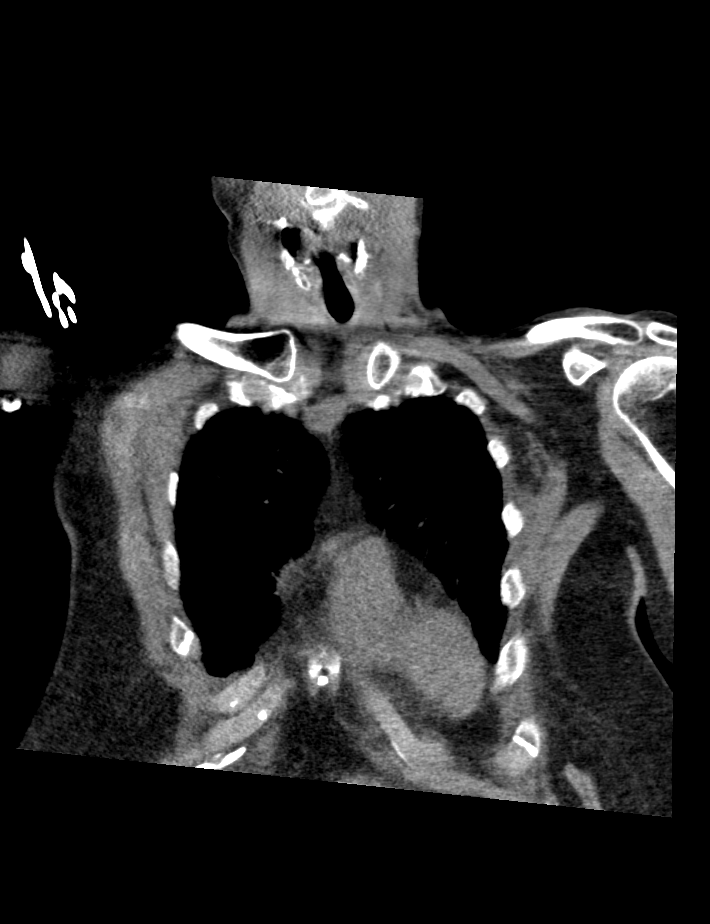
[im 24/116  lung]
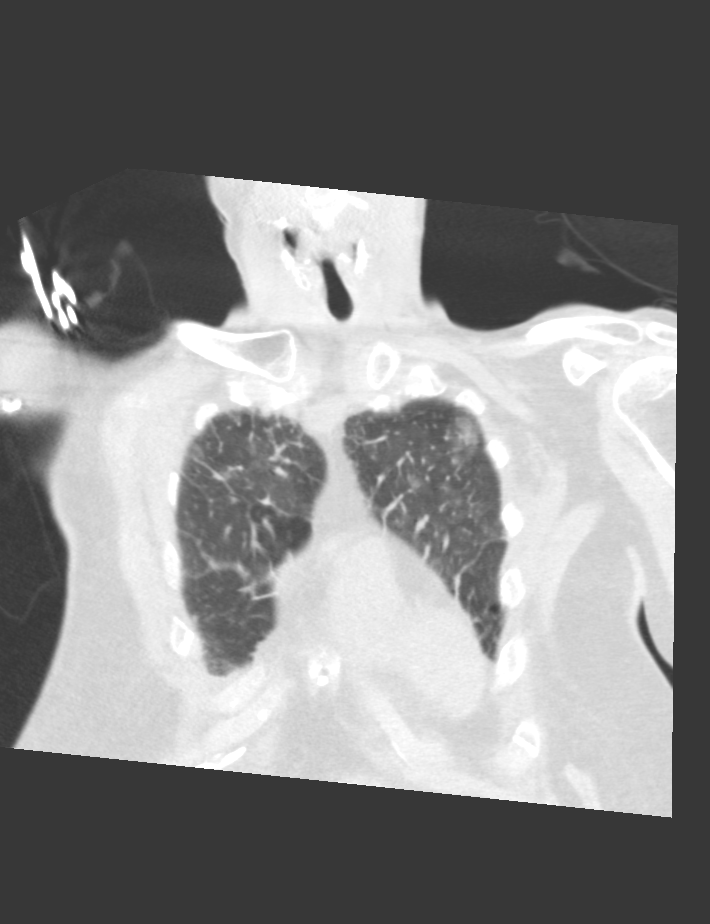
[im 47/116  lung]
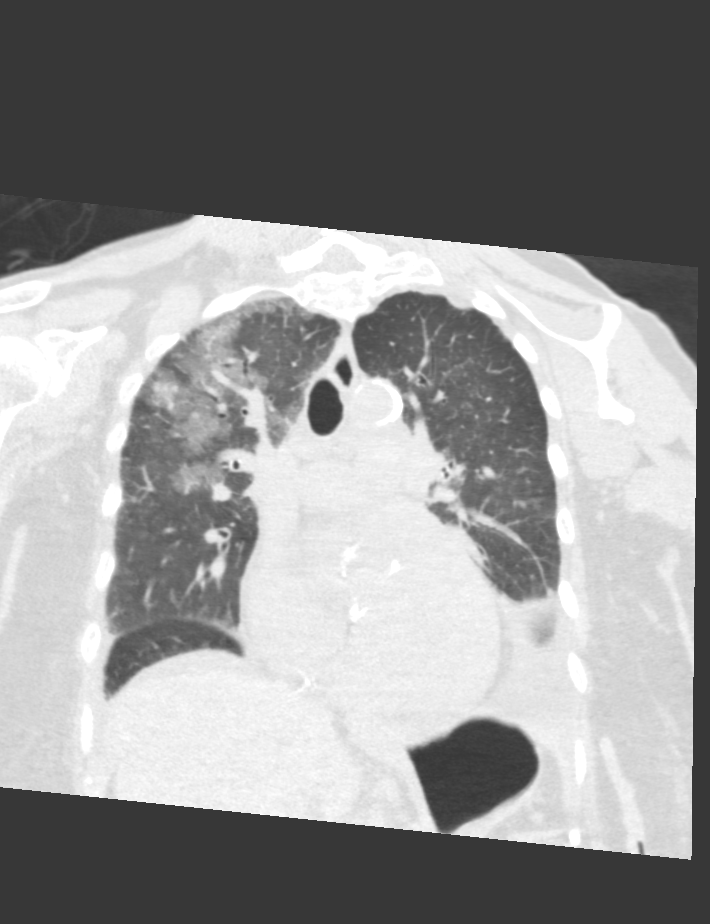
[im 70/116  lung]
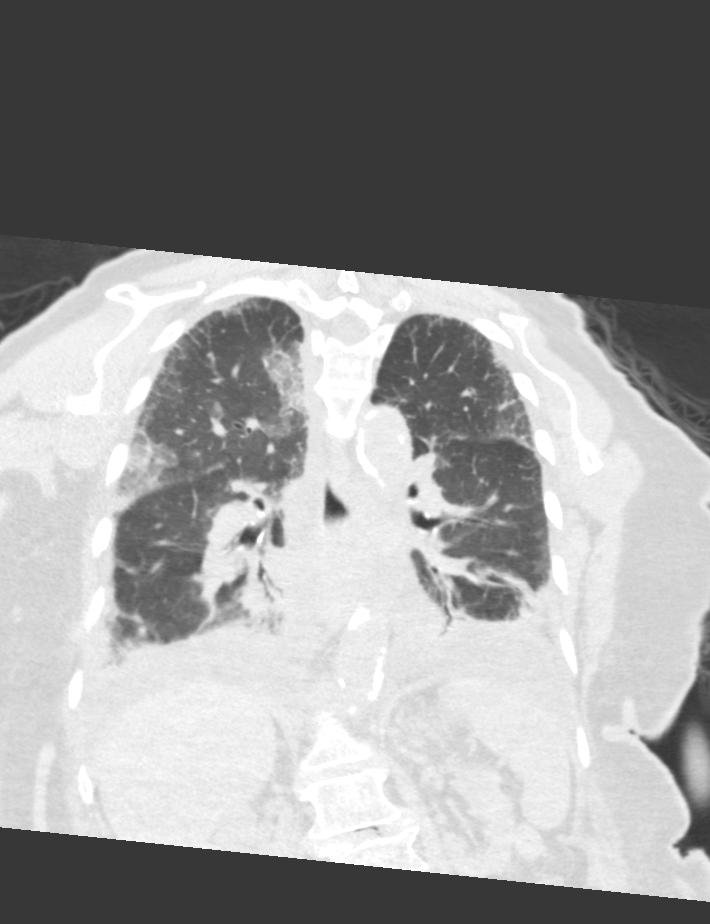
[im 93/116  lung]
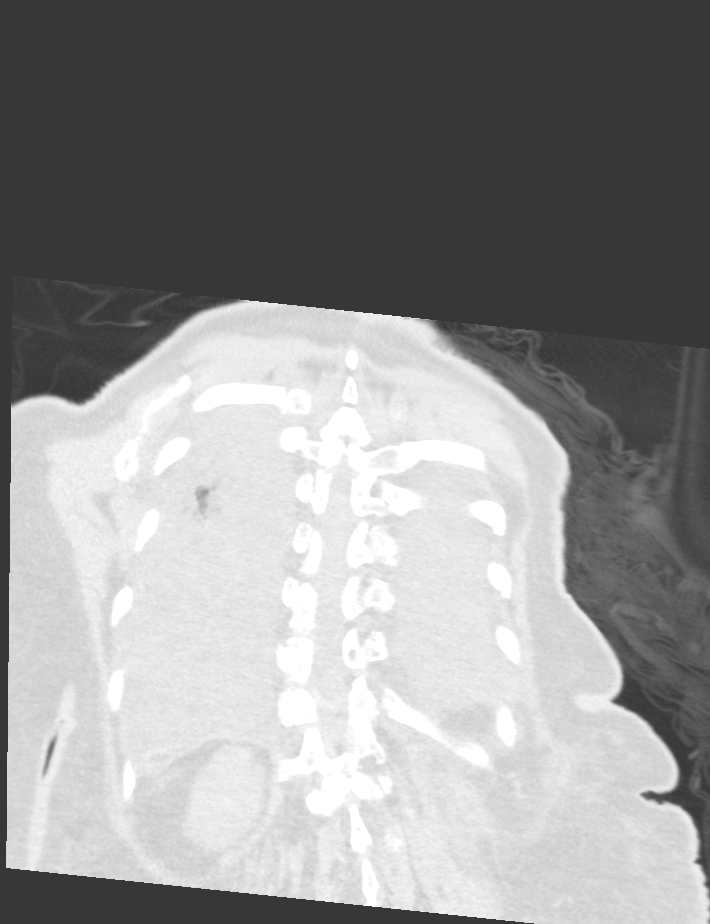

[4 of 36 positions shown; findings below may reference images not displayed]

FINDINGS: THORACIC INLET/BODY WALL:

No acute finding. Nodular appearance of the thyroid gland without
dominant or concerning mass.

MEDIASTINUM:

Large appearance of the left atrium. No pericardial effusion. Bulky
aortic valvular calcifications/sclerosis. Mitral ossification is
annular based. Diffuse atherosclerosis, including the coronary
arteries.

LUNG WINDOWS:

Patchy bilateral airspace disease compatible with pneumonia. Lower
lobe and lingular atelectasis small pleural effusions. No notable
dependent pattern of the airspace disease typical of aspiration
pneumonia. Patent major airways.

UPPER ABDOMEN:

No acute findings.

OSSEOUS:

T12 compression fracture with greater than 75% height loss
centrally. There is moderate retropulsion and canal stenosis. No
subluxation or posterior element fracturing. Gas cleft present
within the dominant fracture plane consistent with unhealed
fracture. Other superior endplate deformities are chronic appearing.
IMPRESSION: 1. Multi focal pneumonia with lower lobe atelectasis and small
pleural effusions. In this patient with history of aspiration,
patent central airways.
2. Unhealed T12 compression fracture. Height loss is advanced and
retropulsion is moderate.
3. Advanced aortic valve calcifications/sclerosis.
